# Patient Record
Sex: Female | Born: 2000 | Hispanic: No | Marital: Single | Smoking: Never smoker
Health system: Southern US, Community
[De-identification: ages and names within clinical notes are randomized; demographics above are authoritative.]

## PROBLEM LIST (undated history)

## (undated) DIAGNOSIS — O139 Gestational [pregnancy-induced] hypertension without significant proteinuria, unspecified trimester: Secondary | ICD-10-CM

## (undated) DIAGNOSIS — D573 Sickle-cell trait: Secondary | ICD-10-CM

## (undated) DIAGNOSIS — Z789 Other specified health status: Secondary | ICD-10-CM

## (undated) HISTORY — DX: Other specified health status: Z78.9

## (undated) HISTORY — DX: Gestational (pregnancy-induced) hypertension without significant proteinuria, unspecified trimester: O13.9

## (undated) HISTORY — PX: NO PAST SURGERIES: SHX2092

## (undated) HISTORY — DX: Sickle-cell trait: D57.3

---
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
## 2020-03-06 ENCOUNTER — Ambulatory Visit: Payer: Medicaid Other | Admitting: Family

## 2020-03-31 ENCOUNTER — Encounter: Payer: Self-pay | Admitting: Family

## 2020-03-31 ENCOUNTER — Other Ambulatory Visit (HOSPITAL_COMMUNITY)
Admission: RE | Admit: 2020-03-31 | Discharge: 2020-03-31 | Disposition: A | Discharge status: Home / Self Care | Payer: Medicaid Other | Source: Ambulatory Visit | Attending: Family | Admitting: Family

## 2020-03-31 ENCOUNTER — Other Ambulatory Visit: Payer: Self-pay

## 2020-03-31 ENCOUNTER — Ambulatory Visit (INDEPENDENT_AMBULATORY_CARE_PROVIDER_SITE_OTHER): Payer: Medicaid Other | Admitting: Pediatrics

## 2020-03-31 VITALS — BP 119/82 | HR 87 | Ht 59.65 in | Wt 185.6 lb

## 2020-03-31 DIAGNOSIS — Z113 Encounter for screening for infections with a predominantly sexual mode of transmission: Secondary | ICD-10-CM | POA: Insufficient documentation

## 2020-03-31 DIAGNOSIS — Z3046 Encounter for surveillance of implantable subdermal contraceptive: Secondary | ICD-10-CM | POA: Diagnosis not present

## 2020-03-31 NOTE — Progress Notes (Signed)
History was provided by the patient.  Judy Underwood is a 20 y.o. female who is here for nexplanon removal.  Theadore Nan, MD   HPI:  Pt reports that she is here for nexplanon removal. Had it placed in 09/2017. She just does not want it any longer, is currently "not really" sexually active. She does have plan B at her house if she were to need it.   Patient's last menstrual period was 03/24/2020.  Review of Systems  Constitutional: Negative for malaise/fatigue.  Eyes: Negative for double vision.  Respiratory: Negative for shortness of breath.   Cardiovascular: Negative for chest pain and palpitations.  Gastrointestinal: Negative for abdominal pain, constipation, diarrhea, nausea and vomiting.  Genitourinary: Negative for dysuria.  Musculoskeletal: Negative for joint pain and myalgias.  Skin: Negative for rash.  Neurological: Negative for dizziness and headaches.  Endo/Heme/Allergies: Does not bruise/bleed easily.    Patient Active Problem List   Diagnosis Date Noted  . Breast lump on left side at 4 o'clock position 06/15/2018  . Surveillance of implantable subdermal contraceptive 10/04/2014  . Heavy menses 02/01/2014  . Psychosocial stressors 07/16/2013    No current outpatient medications on file prior to visit.   No current facility-administered medications on file prior to visit.    No Known Allergies  Physical Exam:    Vitals:   03/31/20 1418  BP: 119/82  Pulse: 87  Weight: 185 lb 9.6 oz (84.2 kg)  Height: 4' 11.65" (1.515 m)    Blood pressure percentiles are not available for patients who are 18 years or older.  Physical Exam Vitals reviewed. Exam conducted with a chaperone present.  Constitutional:      Appearance: Normal appearance.  Skin:    Capillary Refill: Capillary refill takes less than 2 seconds.  Neurological:     Mental Status: She is alert.  Psychiatric:        Mood and Affect: Mood normal.     Assessment/Plan: 1. Encounter for  Nexplanon removal See procedure note. Tolerated well. We discussed other forms of contraception if needed and she has EC at home.   2. Routine screening for STI (sexually transmitted infection) Per protocol.  - Urine cytology ancillary only  Return PRN.   Alfonso Ramus, FNP

## 2020-03-31 NOTE — Patient Instructions (Signed)
Take Plan B within 72 hours if you are sexually active without condoms   Your Nexplanon was removed today and is no longer preventing pregnancy.  If you have sex, remember to use condoms to prevent pregnancy and to prevent sexually transmitted infections.  Leave the outside bandage on for 24 hours.  Leave the smaller bandages on for 3-5 days or until they fall off on their own.  Keep the area clean and dry for 3-5 days.  There is usually bruising or swelling at and around the removal site for a few days to a week after the removal.  If you see redness or pus draining from the removal site, call us immediately.  We would like you to return to the clinic for a follow-up visit in 1 month.  You can call [facility] 24 hours a day with any questions or concerns.  There is always a nurse or doctor available to take your call.  Call 9-1-1 if you have a life-threatening emergency.  For anything else, please call us at [phone] before heading to the ER.

## 2020-03-31 NOTE — Progress Notes (Signed)

## 2020-04-02 ENCOUNTER — Other Ambulatory Visit: Payer: Self-pay | Admitting: Pediatrics

## 2020-04-02 LAB — URINE CYTOLOGY ANCILLARY ONLY
Chlamydia: POSITIVE — AB
Comment: NEGATIVE
Comment: NORMAL
Neisseria Gonorrhea: NEGATIVE

## 2020-04-02 MED ORDER — AZITHROMYCIN 500 MG PO TABS: 1000.0000 mg | ORAL_TABLET | Freq: Once | ORAL | 0 refills | Status: AC

## 2020-08-26 ENCOUNTER — Emergency Department (HOSPITAL_COMMUNITY)
Admission: EM | Admit: 2020-08-26 | Discharge: 2020-08-26 | Disposition: A | Discharge status: Home / Self Care | Payer: Medicaid Other | Attending: Emergency Medicine | Admitting: Emergency Medicine

## 2020-08-26 ENCOUNTER — Other Ambulatory Visit: Payer: Self-pay

## 2020-08-26 ENCOUNTER — Encounter (HOSPITAL_COMMUNITY): Payer: Self-pay | Admitting: *Deleted

## 2020-08-26 ENCOUNTER — Emergency Department (HOSPITAL_COMMUNITY): Payer: Medicaid Other

## 2020-08-26 DIAGNOSIS — Z5321 Procedure and treatment not carried out due to patient leaving prior to being seen by health care provider: Secondary | ICD-10-CM | POA: Insufficient documentation

## 2020-08-26 DIAGNOSIS — R109 Unspecified abdominal pain: Secondary | ICD-10-CM | POA: Insufficient documentation

## 2020-08-26 DIAGNOSIS — M25552 Pain in left hip: Secondary | ICD-10-CM | POA: Diagnosis not present

## 2020-08-26 DIAGNOSIS — Y92410 Unspecified street and highway as the place of occurrence of the external cause: Secondary | ICD-10-CM | POA: Diagnosis not present

## 2020-08-26 DIAGNOSIS — M25551 Pain in right hip: Secondary | ICD-10-CM | POA: Diagnosis not present

## 2020-08-26 LAB — I-STAT BETA HCG BLOOD, ED (MC, WL, AP ONLY): I-stat hCG, quantitative: <5 m[IU]/mL (ref <5)
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
## 2020-08-26 NOTE — ED Notes (Signed)
Lavender and light green tube sent to lab. 

## 2020-08-26 NOTE — ED Notes (Signed)
Pt called, no response
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
## 2020-09-20 ENCOUNTER — Other Ambulatory Visit: Payer: Self-pay

## 2020-09-20 ENCOUNTER — Inpatient Hospital Stay (HOSPITAL_COMMUNITY): Payer: Medicaid Other

## 2020-09-20 ENCOUNTER — Encounter (HOSPITAL_COMMUNITY): Payer: Self-pay | Admitting: Obstetrics & Gynecology

## 2020-09-20 ENCOUNTER — Ambulatory Visit (HOSPITAL_COMMUNITY)
Admission: EM | Admit: 2020-09-20 | Discharge: 2020-09-20 | Disposition: A | Discharge status: Home / Self Care | Payer: Medicaid Other | Attending: Urgent Care | Admitting: Urgent Care

## 2020-09-20 ENCOUNTER — Inpatient Hospital Stay (HOSPITAL_COMMUNITY)
Admission: AD | Admit: 2020-09-20 | Discharge: 2020-09-20 | Disposition: A | Discharge status: Home / Self Care | Payer: Medicaid Other | Source: Ambulatory Visit | Attending: Obstetrics & Gynecology | Admitting: Obstetrics & Gynecology

## 2020-09-20 DIAGNOSIS — N912 Amenorrhea, unspecified: Secondary | ICD-10-CM | POA: Diagnosis not present

## 2020-09-20 DIAGNOSIS — O26891 Other specified pregnancy related conditions, first trimester: Secondary | ICD-10-CM

## 2020-09-20 DIAGNOSIS — O23591 Infection of other part of genital tract in pregnancy, first trimester: Secondary | ICD-10-CM | POA: Diagnosis not present

## 2020-09-20 DIAGNOSIS — R1084 Generalized abdominal pain: Secondary | ICD-10-CM

## 2020-09-20 DIAGNOSIS — Z3A01 Less than 8 weeks gestation of pregnancy: Secondary | ICD-10-CM | POA: Diagnosis not present

## 2020-09-20 DIAGNOSIS — Z3201 Encounter for pregnancy test, result positive: Secondary | ICD-10-CM | POA: Diagnosis not present

## 2020-09-20 DIAGNOSIS — N760 Acute vaginitis: Secondary | ICD-10-CM | POA: Diagnosis not present

## 2020-09-20 DIAGNOSIS — O26899 Other specified pregnancy related conditions, unspecified trimester: Secondary | ICD-10-CM

## 2020-09-20 DIAGNOSIS — R109 Unspecified abdominal pain: Secondary | ICD-10-CM | POA: Diagnosis not present

## 2020-09-20 DIAGNOSIS — B9689 Other specified bacterial agents as the cause of diseases classified elsewhere: Secondary | ICD-10-CM | POA: Insufficient documentation

## 2020-09-20 LAB — WET PREP, GENITAL
Sperm: NONE SEEN
Trich, Wet Prep: NONE SEEN
Yeast Wet Prep HPF POC: NONE SEEN

## 2020-09-20 LAB — URINALYSIS, ROUTINE W REFLEX MICROSCOPIC
Bacteria, UA: NONE SEEN
Bilirubin Urine: NEGATIVE
Glucose, UA: NEGATIVE mg/dL
Hgb urine dipstick: NEGATIVE
Ketones, ur: 80 mg/dL — AB
Nitrite: NEGATIVE
Protein, ur: NEGATIVE mg/dL
Specific Gravity, Urine: 1.028 (ref 1.005–1.030)
pH: 6 (ref 5.0–8.0)

## 2020-09-20 LAB — CBC
HCT: 38.7 % (ref 36.0–46.0)
Hemoglobin: 12.9 g/dL (ref 12.0–15.0)
MCH: 28.9 pg (ref 26.0–34.0)
MCHC: 33.3 g/dL (ref 30.0–36.0)
MCV: 86.6 fL (ref 80.0–100.0)
Platelets: 303 10*3/uL (ref 150–400)
RBC: 4.47 MIL/uL (ref 3.87–5.11)
RDW: 12.5 % (ref 11.5–15.5)
WBC: 11.4 10*3/uL — ABNORMAL HIGH (ref 4.0–10.5)
nRBC: 0 % (ref 0.0–0.2)

## 2020-09-20 LAB — ABO/RH: ABO/RH(D): O POS

## 2020-09-20 LAB — HCG, QUANTITATIVE, PREGNANCY: hCG, Beta Chain, Quant, S: 9267 m[IU]/mL — ABNORMAL HIGH (ref <5)

## 2020-09-20 LAB — POC URINE PREG, ED: Preg Test, Ur: POSITIVE — AB

## 2020-09-20 MED ORDER — METRONIDAZOLE 0.75 % VA GEL: 1.0000 | Freq: Every day | VAGINAL | 1 refills | Status: DC

## 2020-09-20 NOTE — MAU Provider Note (Signed)
Chief Complaint:  Abdominal Pain   Event Date/Time   First Provider Initiated Contact with Patient 09/20/20 1608     HPI: Judy Underwood is a 20 y.o. G1P0 at [redacted]w[redacted]d who presents to maternity admissions reporting positive pregnancy test and abdominal pain. Patient reports she was seen at Urgent Care this morning for confirmation of pregnancy and after the MD did his physical assessment she started to have generalized stabbing right sided pain. She denies vaginal bleeding, discharge, or urinary s/s. LMP was 08/14/20  Pregnancy Course:   Past Medical History:  Diagnosis Date   Sickle cell trait (HCC) 07/16/2013   OB History  Gravida Para Term Preterm AB Living  1            SAB IAB Ectopic Multiple Live Births               # Outcome Date GA Lbr Len/2nd Weight Sex Delivery Anes PTL Lv  1 Current            History reviewed. No pertinent surgical history. Family History  Problem Relation Age of Onset   Asthma Neg Hx    Eczema Neg Hx    Social History   Tobacco Use   Smoking status: Never   Smokeless tobacco: Never  Substance Use Topics   Alcohol use: Never    Alcohol/week: 0.0 standard drinks   Drug use: Never   No Known Allergies No medications prior to admission.    I have reviewed patient's Past Medical Hx, Surgical Hx, Family Hx, Social Hx, medications and allergies.   ROS:  Review of Systems  Constitutional: Negative.   Respiratory: Negative.    Cardiovascular: Negative.   Gastrointestinal:  Positive for abdominal pain. Negative for diarrhea, nausea and vomiting.  Genitourinary: Negative.   Musculoskeletal: Negative.   Neurological: Negative.   Psychiatric/Behavioral: Negative.     Physical Exam  Patient Vitals for the past 24 hrs:  BP Pulse Resp SpO2  09/20/20 1755 126/73 79 14 --  09/20/20 1558 118/70 82 -- --  09/20/20 1550 121/63 79 13 100 %   Constitutional: well-developed, well-nourished female in no acute distress.  Cardiovascular: normal  rate Respiratory: normal effort GI: abd soft, non-tender MS: extremities nontender, no edema, normal ROM Neurologic: alert and oriented x 4.  GU: neg CVAT. Pelvic: deferred, blind swabs obtained    Labs: Results for orders placed or performed during the hospital encounter of 09/20/20 (from the past 24 hour(s))  Wet prep, genital     Status: Abnormal   Collection Time: 09/20/20  4:09 PM   Specimen: Vaginal  Result Value Ref Range   Yeast Wet Prep HPF POC NONE SEEN NONE SEEN   Trich, Wet Prep NONE SEEN NONE SEEN   Clue Cells Wet Prep HPF POC PRESENT (A) NONE SEEN   WBC, Wet Prep HPF POC MANY (A) NONE SEEN   Sperm NONE SEEN   CBC     Status: Abnormal   Collection Time: 09/20/20  4:22 PM  Result Value Ref Range   WBC 11.4 (H) 4.0 - 10.5 K/uL   RBC 4.47 3.87 - 5.11 MIL/uL   Hemoglobin 12.9 12.0 - 15.0 g/dL   HCT 10.9 32.3 - 55.7 %   MCV 86.6 80.0 - 100.0 fL   MCH 28.9 26.0 - 34.0 pg   MCHC 33.3 30.0 - 36.0 g/dL   RDW 32.2 02.5 - 42.7 %   Platelets 303 150 - 400 K/uL   nRBC 0.0 0.0 - 0.2 %  hCG, quantitative, pregnancy     Status: Abnormal   Collection Time: 09/20/20  4:22 PM  Result Value Ref Range   hCG, Beta Chain, Quant, S 9,267 (H) <5 mIU/mL  ABO/Rh     Status: None   Collection Time: 09/20/20  4:22 PM  Result Value Ref Range   ABO/RH(D) O POS    No rh immune globuloin      NOT A RH IMMUNE GLOBULIN CANDIDATE, PT RH POSITIVE Performed at [facility], [address], [place], [place] [address]     Imaging:  US OB LESS THAN 14 WEEKS WITH [place] TRANSVAGINAL  Result Date: 09/20/2020 CLINICAL DATA:  Abdominal pain. EXAM: OBSTETRIC <14 WK ULTRASOUND TECHNIQUE: Transabdominal ultrasound was performed for evaluation of the gestation as well as the maternal uterus and adnexal regions. COMPARISON:  None. FINDINGS: Intrauterine gestational sac: Single Yolk sac:  Visualized Embryo:  Not Visualized. MSD:  8.4 mm   5 w   4 d CRL:     mm    w  d                  [place] EDC:  Subchorionic hemorrhage:  None visualized. Maternal uterus/adnexae: Normal. IMPRESSION: An IUP is identified. There is a gestational sac and a yolk sac. A fetal pole is not yet seen. Recommend attention on follow-up. Electronically Signed   By: Gerome Sam III M.D   On: 09/20/2020 17:32     MAU Course: Orders Placed This Encounter  Procedures   Wet prep, genital   US OB LESS THAN 14 WEEKS WITH OB TRANSVAGINAL   US OB LESS THAN 14 WEEKS WITH OB TRANSVAGINAL   CBC   hCG, quantitative, pregnancy   Urinalysis, Routine w reflex microscopic   ABO/Rh   Discharge patient   Meds ordered this encounter  Medications   metroNIDAZOLE (METROGEL) 0.75 % vaginal gel    Sig: Place 1 Applicatorful vaginally at bedtime. Apply one applicatorful to vagina at bedtime for 5 days    Dispense:  70 g    Refill:  1    Order Specific Question:   Supervising Provider    Answer:   Reva Bores [[id number]]    MDM: UA CBC, Hcg, ABO Ultrasound with results as above Wet prep +BV, will RX metrogel GC/CT pending Recommend repeat US in 10-14 days. Precautions given.    Assessment: 1. BV (bacterial vaginosis)   2. Abdominal pain affecting pregnancy     Plan: Discharge home in stable condition  Strict return precautions given Follow [facility] with [facility] for [facility]   Follow-up Information     [facility] Follow [facility].   Specialty: Radiology Why: someone will call you to schedule ultrasound. Contact information: [address] [place] [address] [phone]        [facility] [employer] [facility] .   Specialty: Obstetrics and Gynecology Contact information: [address] [place] [address] [phone]                Allergies as of 09/20/2020   No Known Allergies      Medication List     TAKE these medications    metroNIDAZOLE 0.75 % vaginal gel Commonly known  as: METROGEL Place 1 Applicatorful vaginally at bedtime. Apply one applicatorful to vagina at bedtime for 5 days        Camelia Eng, MSN, CNM 09/20/2020 6:20 PM

## 2020-09-20 NOTE — Discharge Instructions (Addendum)
Please report to the Maternal Admission Unit for an emergent evaluation and rule out of an ectopic pregnancy given your abdominal pain and positive pregnancy test.

## 2020-09-20 NOTE — ED Triage Notes (Signed)
Pt reports here with breast soreness and missed period, requests preg test.

## 2020-09-20 NOTE — ED Provider Notes (Signed)
[facility]   MRN: [id number] DOB: 10-29-2000  Subjective:   Judy Underwood is a 20 y.o. female presenting for amenorrhea.  Patient reports LMP was 08/14/2020.  She is also has some breast soreness, nausea without vomiting and right lower quadrant abdominal pain.  This pain has been intermittent, does not have any at the moment.  Denies fever, vaginal discharge, dysuria, urinary frequency.  Patient is sexually active, does not use condoms for protection.  No oral contraception.  No current facility-administered medications for this encounter. No current outpatient medications on file.   No Known Allergies  Past Medical History:  Diagnosis Date   Sickle cell trait (HCC) 07/16/2013     No past surgical history on file.  Family History  Problem Relation Age of Onset   Asthma Neg Hx    Eczema Neg Hx     Social History   Tobacco Use   Smoking status: Never   Smokeless tobacco: Never  Substance Use Topics   Alcohol use: Never    Alcohol/week: 0.0 standard drinks   Drug use: Never    ROS   Objective:   Vitals: BP 112/72   Pulse 74   Temp 98.4 F (36.9 C)   Resp 18   LMP 08/14/2020   SpO2 99%   Physical Exam Constitutional:      General: She is not in acute distress.    Appearance: Normal appearance. She is well-developed and normal weight. She is not ill-appearing, toxic-appearing or diaphoretic.  HENT:     Head: Normocephalic and atraumatic.     Right Ear: External ear normal.     Left Ear: External ear normal.     Nose: Nose normal.     Mouth/Throat:     Mouth: Mucous membranes are moist.     Pharynx: Oropharynx is clear.  Eyes:     General: No scleral icterus.    Extraocular Movements: Extraocular movements intact.     Pupils: Pupils are equal, round, and reactive to light.  Cardiovascular:     Rate and Rhythm: Normal rate and regular rhythm.     Heart sounds: Normal heart sounds. No murmur heard.   No friction rub. No gallop.   Pulmonary:     Effort: Pulmonary effort is normal. No respiratory distress.     Breath sounds: Normal breath sounds. No stridor. No wheezing, rhonchi or rales.  Abdominal:     General: Bowel sounds are normal. There is no distension.     Palpations: Abdomen is soft. There is no mass.     Tenderness: There is abdominal tenderness in the right upper quadrant, right lower quadrant, epigastric area and left upper quadrant. There is no right CVA tenderness, left CVA tenderness, guarding or rebound.  Skin:    General: Skin is warm and dry.     Coloration: Skin is not pale.     Findings: No rash.  Neurological:     General: No focal deficit present.     Mental Status: She is alert and oriented to person, place, and time.  Psychiatric:        Mood and Affect: Mood normal.        Behavior: Behavior normal.        Thought Content: Thought content normal.        Judgment: Judgment normal.    Results for orders placed or performed during the hospital encounter of 09/20/20 (from the past 24 hour(s))  POC urine pregnancy  Status: Abnormal   Collection Time: 09/20/20  1:44 PM  Result Value Ref Range   Preg Test, Ur POSITIVE (A) NEGATIVE    Assessment and Plan :   PDMP not reviewed this encounter.  1. Positive pregnancy test   2. Amenorrhea   3. Generalized abdominal pain     As patient has a positive pregnancy test and abdominal pain on exam recommended evaluation at the [facility] emergency room through the [facility].  I provided her with instructions on how to get there.  She is hemodynamically stable and therefore capable of transport by personal vehicle.  Contracts for safety and will report there now for rule out of ectopic pregnancy.   Judy Underwood, [place] 09/20/20 [phone]

## 2020-09-20 NOTE — MAU Note (Addendum)
Judy Underwood is a 20 y.o. at [redacted]w[redacted]d here in MAU reporting: lower right abdominal pain that started this morning when the doctor at urgent care pushed on her stomach. No pain before then. States she only went to urgent care to confirm her pregnancy. Denies any VB. Last IC was last week.  LMP: 08/14/20  Pain score: 4

## 2020-09-22 LAB — GC/CHLAMYDIA PROBE AMP (~~LOC~~) NOT AT ARMC
Chlamydia: POSITIVE — AB
Comment: NEGATIVE
Comment: NORMAL
Neisseria Gonorrhea: NEGATIVE

## 2020-09-23 ENCOUNTER — Other Ambulatory Visit: Payer: Self-pay

## 2020-09-23 DIAGNOSIS — O98819 Other maternal infectious and parasitic diseases complicating pregnancy, unspecified trimester: Secondary | ICD-10-CM

## 2020-09-23 MED ORDER — AZITHROMYCIN 500 MG PO TABS: 1000.0000 mg | ORAL_TABLET | Freq: Once | ORAL | 0 refills | Status: AC

## 2020-10-01 ENCOUNTER — Ambulatory Visit
Admission: RE | Admit: 2020-10-01 | Discharge: 2020-10-01 | Disposition: A | Discharge status: Home / Self Care | Payer: Medicaid Other | Source: Ambulatory Visit

## 2020-10-01 ENCOUNTER — Other Ambulatory Visit: Payer: Self-pay

## 2020-10-01 DIAGNOSIS — R109 Unspecified abdominal pain: Secondary | ICD-10-CM | POA: Diagnosis not present

## 2020-10-01 DIAGNOSIS — O26891 Other specified pregnancy related conditions, first trimester: Secondary | ICD-10-CM | POA: Diagnosis not present

## 2020-10-01 DIAGNOSIS — Z3A01 Less than 8 weeks gestation of pregnancy: Secondary | ICD-10-CM | POA: Diagnosis not present

## 2020-10-01 DIAGNOSIS — O26899 Other specified pregnancy related conditions, unspecified trimester: Secondary | ICD-10-CM | POA: Diagnosis not present

## 2020-10-01 DIAGNOSIS — O3680X0 Pregnancy with inconclusive fetal viability, not applicable or unspecified: Secondary | ICD-10-CM | POA: Diagnosis not present

## 2020-10-18 ENCOUNTER — Ambulatory Visit (HOSPITAL_COMMUNITY)
Admission: EM | Admit: 2020-10-18 | Discharge: 2020-10-18 | Disposition: A | Discharge status: Home / Self Care | Payer: Medicaid Other | Attending: Emergency Medicine | Admitting: Emergency Medicine

## 2020-10-18 ENCOUNTER — Encounter (HOSPITAL_COMMUNITY): Payer: Self-pay

## 2020-10-18 DIAGNOSIS — Z113 Encounter for screening for infections with a predominantly sexual mode of transmission: Secondary | ICD-10-CM | POA: Diagnosis not present

## 2020-10-18 NOTE — ED Triage Notes (Addendum)
Pt states she was diagnosed with chlamydia and treated and just wanted to be checked again. Denies symptoms. Currently [redacted]W[redacted]D pregnant.

## 2020-10-18 NOTE — Discharge Instructions (Signed)
We will contact you if the results from your lab work are positive and require additional treatment.    Do not have sex while taking undergoing treatment for STI.  Make sure that all of your partners get tested and treated.   Use a condom or other barrier method for all sexual encounters.    Return or go to the Emergency Department if symptoms worsen or do not improve in the next few days.  

## 2020-10-18 NOTE — ED Provider Notes (Addendum)
[facility]    CSN: [id number] Arrival date & time: 10/18/20  1252      History   Chief Complaint Chief Complaint  Patient presents with   Exposure to STD    HPI Judy Underwood is a 20 y.o. female.   Patient here for STI screen.  Reports testing positive for chlamydia about 1 month ago and was treated.  Denies any current symptoms.  Patient is [redacted]w[redacted]d pregnant. Denies any known exposures to STI positive partners.  Denies any trauma, injury, or other precipitating event.  Denies any specific alleviating or aggravating factors.  Denies any fevers, chest pain, shortness of breath, N/V/D, numbness, tingling, weakness, abdominal pain, or headaches.     The history is provided by the patient.  Exposure to STD   Past Medical History:  Diagnosis Date   Sickle cell trait (HCC) 07/16/2013    Patient Active Problem List   Diagnosis Date Noted   Breast lump on left side at 4 o'clock position 06/15/2018   Surveillance of implantable subdermal contraceptive 10/04/2014   Heavy menses 02/01/2014   Psychosocial stressors 07/16/2013    History reviewed. No pertinent surgical history.  OB History     Gravida  1   Para      Term      Preterm      AB      Living         SAB      IAB      Ectopic      Multiple      Live Births               Home Medications    Prior to Admission medications   Medication Sig Start Date End Date Taking? Authorizing Provider  metroNIDAZOLE (METROGEL) 0.75 % vaginal gel Place 1 Applicatorful vaginally at bedtime. Apply one applicatorful to vagina at bedtime for 5 days 09/20/20   Brand Males, CNM    Family History Family History  Problem Relation Age of Onset   Asthma Neg Hx    Eczema Neg Hx     Social History Social History   Tobacco Use   Smoking status: Never   Smokeless tobacco: Never  Substance Use Topics   Alcohol use: Never    Alcohol/week: 0.0 standard drinks   Drug use: Never     Allergies    Patient has no known allergies.   Review of Systems Review of Systems  Genitourinary:  Negative for vaginal bleeding, vaginal discharge and vaginal pain.  All other systems reviewed and are negative.   Physical Exam Triage Vital Signs ED Triage Vitals  Enc Vitals Group     BP 10/18/20 1330 (!) 119/59     Pulse Rate 10/18/20 1330 69     Resp 10/18/20 1330 18     Temp 10/18/20 1330 98.1 F (36.7 C)     Temp Source 10/18/20 1330 Oral     SpO2 10/18/20 1330 100 %     Weight --      Height --      Head Circumference --      Peak Flow --      Pain Score 10/18/20 1332 0     Pain Loc --      Pain Edu? --      Excl. in GC? --    No data found.  Updated Vital Signs BP (!) 119/59 (BP Location: Left Arm)   Pulse 69   Temp 98.1  F (36.7 C) (Oral)   Resp 18   LMP 08/14/2020   SpO2 100%   Visual Acuity Right Eye Distance:   Left Eye Distance:   Bilateral Distance:    Right Eye Near:   Left Eye Near:    Bilateral Near:     Physical Exam Vitals and nursing note reviewed.  Constitutional:      General: She is not in acute distress.    Appearance: Normal appearance. She is not ill-appearing, toxic-appearing or diaphoretic.  HENT:     Head: Normocephalic and atraumatic.  Eyes:     Conjunctiva/sclera: Conjunctivae normal.  Cardiovascular:     Rate and Rhythm: Normal rate.     Pulses: Normal pulses.  Pulmonary:     Effort: Pulmonary effort is normal.  Abdominal:     General: Abdomen is flat.  Musculoskeletal:        General: Normal range of motion.     Cervical back: Normal range of motion.  Skin:    General: Skin is warm and dry.  Neurological:     General: No focal deficit present.     Mental Status: She is alert and oriented to person, place, and time.  Psychiatric:        Mood and Affect: Mood normal.     UC Treatments / Results  Labs (all labs ordered are listed, but only abnormal results are displayed) Labs Reviewed  CERVICOVAGINAL ANCILLARY ONLY     EKG   Radiology No results found.  Procedures Procedures (including critical care time)  Medications Ordered in UC Medications - No data to display  Initial Impression / Assessment and Plan / UC Course  I have reviewed the triage vital signs and the nursing notes.  Pertinent labs & imaging results that were available during my care of the patient were reviewed by me and considered in my medical decision making (see chart for details).    Assessment negative for red flags or concerns.  Self swab obtained and will treat based on results.  Discussed need to abstain from sex while waiting on treatment.  Discussed that patient should not have sex while undergoing treatment for STI and safe sex practices including condom or other barrier method use.  Follow up with OB/GYN.  Final Clinical Impressions(s) / UC Diagnoses   Final diagnoses:  Screen for STD (sexually transmitted disease)     Discharge Instructions      We will contact you if the results from your lab work are positive and require additional treatment.    Do not have sex while taking undergoing treatment for STI.  Make sure that all of your partners get tested and treated.   Use a condom or other barrier method for all sexual encounters.    Return or go to the Emergency Department if symptoms worsen or do not improve in the next few days.       ED Prescriptions   None    PDMP not reviewed this encounter.   Ivette Loyal, NP 10/18/20 1351    Ivette Loyal, NP 10/18/20 1351

## 2020-10-20 LAB — CERVICOVAGINAL ANCILLARY ONLY
Bacterial Vaginitis (gardnerella): NEGATIVE
Candida Glabrata: NEGATIVE
Candida Vaginitis: POSITIVE — AB
Chlamydia: NEGATIVE
Comment: NEGATIVE
Comment: NEGATIVE
Comment: NEGATIVE
Comment: NEGATIVE
Comment: NEGATIVE
Comment: NORMAL
Neisseria Gonorrhea: NEGATIVE
Trichomonas: NEGATIVE

## 2020-10-21 ENCOUNTER — Telehealth (HOSPITAL_COMMUNITY): Payer: Self-pay | Admitting: Emergency Medicine

## 2020-10-21 MED ORDER — TERCONAZOLE 0.4 % VA CREA: 1.0000 | TOPICAL_CREAM | Freq: Every day | VAGINAL | 0 refills | Status: AC

## 2020-10-27 ENCOUNTER — Other Ambulatory Visit: Payer: Self-pay

## 2020-10-27 ENCOUNTER — Ambulatory Visit (INDEPENDENT_AMBULATORY_CARE_PROVIDER_SITE_OTHER): Payer: Medicaid Other | Admitting: *Deleted

## 2020-10-27 VITALS — BP 129/76 | HR 75 | Temp 98.3°F | Wt 181.2 lb

## 2020-10-27 DIAGNOSIS — O368390 Maternal care for abnormalities of the fetal heart rate or rhythm, unspecified trimester, not applicable or unspecified: Secondary | ICD-10-CM

## 2020-10-27 DIAGNOSIS — O219 Vomiting of pregnancy, unspecified: Secondary | ICD-10-CM

## 2020-10-27 DIAGNOSIS — Z3400 Encounter for supervision of normal first pregnancy, unspecified trimester: Secondary | ICD-10-CM | POA: Diagnosis not present

## 2020-10-27 DIAGNOSIS — Z31430 Encounter of female for testing for genetic disease carrier status for procreative management: Secondary | ICD-10-CM | POA: Diagnosis not present

## 2020-10-27 HISTORY — DX: Encounter for supervision of normal first pregnancy, unspecified trimester: Z34.00

## 2020-10-27 LAB — OB RESULTS CONSOLE GBS: GBS: POSITIVE

## 2020-10-27 MED ORDER — PROMETHAZINE HCL 25 MG PO TABS: 25.0000 mg | ORAL_TABLET | Freq: Four times a day (QID) | ORAL | 1 refills | Status: DC | PRN

## 2020-10-27 MED ORDER — BLOOD PRESSURE MONITOR AUTOMAT DEVI: 1.0000 | Freq: Every day | 0 refills | Status: DC

## 2020-10-27 MED ORDER — PRENATAL 27-1 MG PO TABS: 1.0000 | ORAL_TABLET | Freq: Every day | ORAL | 12 refills | Status: DC

## 2020-10-27 MED ORDER — GOJJI WEIGHT SCALE MISC: 1.0000 | Freq: Every day | 0 refills | Status: DC | PRN

## 2020-10-27 NOTE — Progress Notes (Signed)
   Location: [facility] Patient: Clinic Provider: clinic  PRENATAL INTAKE SUMMARY  Ms. Abernathy presents today New OB Nurse Interview.  OB History     Gravida  1   Para      Term      Preterm      AB      Living         SAB      IAB      Ectopic      Multiple      Live Births             I have reviewed the patient's medical, obstetrical, social, and family histories, medications, and available lab results.  SUBJECTIVE She complains of nausea without vomiting for 3-4 days. Also reported off white vaginal discharge, denies odor, itching or burning.  OBJECTIVE Initial Nurse interview for history/labs (New OB).  EDD: 05/21/2021 by LMP GA: [redacted]w[redacted]d G1P0 FHT: unable to hear fetal heart tones  GENERAL APPEARANCE: alert, well appearing, in no apparent distress, oriented to person, place and time, overweight   ASSESSMENT Normal pregnancy  PLAN Prenatal care:  [facility] Pnl/HIV/Hep C OB Urine Culture GC/CT HgbEval/SMA/CF ([facility]) [facility]ama A1C Rx [facility] for blood pressure monitor and weight scale Rx for PNV Rx for Phenergan 25 mg 1 tab PO every 6 hours as needed for nausea Ultrasound MFM >14 weeks complete for anatomy scan Ultrasound <14 weeks to assess viability and fetal heart tones  Follow Up Instructions:   I discussed the assessment and treatment plan with the patient. The patient was provided an opportunity to ask questions and all were answered. The patient agreed with the plan and demonstrated an understanding of the instructions.   The patient was advised to call back or seek an in-person evaluation if the symptoms worsen or if the condition fails to improve as anticipated.  I provided 40 minutes of  face-to-face time during this encounter.  Clovis Pu, RN

## 2020-10-27 NOTE — Patient Instructions (Signed)
 Genetic Screening Results Information: You are having genetic testing called [facility] today.  It will take approximately 2 weeks before the results are available.  To get your results, you need Internet access to a web browser to search [facility]/MyChart (the direct app on your phone will not give you these results).  Then select Lab Scanned and click on the blue hyper link that says View Image to see your Panorama results.  You can also use the directions on the purple card given to look up your results directly on the [facility] website.   AREA PEDIATRIC/FAMILY PRACTICE PHYSICIANS  [facility] [address] [place], [place] [address] Phone - [phone]   Fax - [phone]  Judy Underwood [address] [place], [place]  [address] Phone - [phone]   Fax - [phone]  [facility] [address] [place], [place]  [address] Phone - [phone]   Fax - [phone]  [facility] [address] [place], [place]  [address] Phone - [phone]   Fax - [phone]   [facility] [address] [place], [place]  [address] Phone - [phone]   Fax - [phone]  [facility] [address] [place], [place]  [address] Phone - [phone]   Fax - [phone]  [facility] [address] [place], [place]  [address] Phone - [phone]   Fax - [phone]  [facility] [address] [place], [place]  [address] Phone - [phone]   Fax - [phone]  [facility] [address] [place], [place]  [address] Phone - [phone]   Fax - [phone] [facility] [address] [place], [place]  [address] Phone - [phone]   Fax - [phone]  [facility] [address] [place], [place]  [address] Phone -  [phone]   Fax - [phone]  [facility] [address] [place], [place]  [address] Phone - [phone]   Fax - [phone]  [facility] [address] [place], [place]  [address] Phone - [phone]   Fax - [phone]  Judy Underwood [address] [place], [place]  [address] Phone - [phone]  [facility] [address] [place], [place]  [address] Phone - [phone]   Fax - [phone]  [facility] [address] [place], [place]  [address] Phone - [phone]   Fax - [phone]  [facility] [address] [place], [place]  [address] Phone - [phone]   Fax - [phone]  [facility] [address] [place], [place]  [address] Phone - [phone]   Fax - [phone]   [facility] [address] [place], [place]  [address] Phone - [phone]   Fax - [phone]  [facility] [address] [place], [place]  [address] Phone - [phone]   Fax - [phone]  Jud[facility] Underwood [address] , [place]  [address] Phone - [phone]   Fax - [phone]  [facility] [address] , [place]  [address] Phone - [phone]   Fax - [phone]  [facility] [address] , [place]  [address] Phone - [phone]   Fax - [phone] [facility]   [address] [place], [place]  [address] Phone - [phone]   Fax - [phone]  [facility] [address] [place], [place]  [address] Phone - [phone]   Fax - [phone]  [facility] [address] [place], [place]  [address] Phone - [phone]   Fax - [phone]   

## 2020-10-28 LAB — OBSTETRIC PANEL, INCLUDING HIV
Antibody Screen: NEGATIVE
Basophils Absolute: 0 10*3/uL (ref 0.0–0.2)
Basos: 0 %
EOS (ABSOLUTE): 0.1 10*3/uL (ref 0.0–0.4)
Eos: 1 %
HIV Screen 4th Generation wRfx: NONREACTIVE
Hematocrit: 40.6 % (ref 34.0–46.6)
Hemoglobin: 13 g/dL (ref 11.1–15.9)
Hepatitis B Surface Ag: NEGATIVE
Immature Grans (Abs): 0 10*3/uL (ref 0.0–0.1)
Immature Granulocytes: 0 %
Lymphocytes Absolute: 1.6 10*3/uL (ref 0.7–3.1)
Lymphs: 19 %
MCH: 27.5 pg (ref 26.6–33.0)
MCHC: 32 g/dL (ref 31.5–35.7)
MCV: 86 fL (ref 79–97)
Monocytes Absolute: 0.5 10*3/uL (ref 0.1–0.9)
Monocytes: 6 %
Neutrophils Absolute: 6.2 10*3/uL (ref 1.4–7.0)
Neutrophils: 74 %
Platelets: 305 10*3/uL (ref 150–450)
RBC: 4.72 x10E6/uL (ref 3.77–5.28)
RDW: 12.2 % (ref 11.7–15.4)
RPR Ser Ql: NONREACTIVE
Rh Factor: POSITIVE
Rubella Antibodies, IGG: 9.14 index (ref >0.99)
WBC: 8.3 10*3/uL (ref 3.4–10.8)

## 2020-10-28 LAB — HEMOGLOBIN A1C
Est. average glucose Bld gHb Est-mCnc: 88 mg/dL
Hgb A1c MFr Bld: 4.7 % — ABNORMAL LOW (ref 4.8–5.6)

## 2020-10-28 LAB — HEPATITIS C ANTIBODY: Hep C Virus Ab: 0.2 s/co ratio (ref 0.0–0.9)

## 2020-11-02 LAB — CULTURE, OB URINE

## 2020-11-02 LAB — URINE CULTURE, OB REFLEX

## 2020-11-04 ENCOUNTER — Emergency Department (HOSPITAL_COMMUNITY): Payer: Medicaid Other

## 2020-11-04 ENCOUNTER — Encounter (HOSPITAL_COMMUNITY): Payer: Self-pay

## 2020-11-04 ENCOUNTER — Emergency Department (HOSPITAL_COMMUNITY)
Admission: EM | Admit: 2020-11-04 | Discharge: 2020-11-04 | Disposition: A | Discharge status: Home / Self Care | Payer: Medicaid Other | Attending: Emergency Medicine | Admitting: Emergency Medicine

## 2020-11-04 ENCOUNTER — Ambulatory Visit: Admission: RE | Admit: 2020-11-04 | Payer: No Typology Code available for payment source | Source: Ambulatory Visit

## 2020-11-04 ENCOUNTER — Other Ambulatory Visit: Payer: Self-pay

## 2020-11-04 DIAGNOSIS — R079 Chest pain, unspecified: Secondary | ICD-10-CM | POA: Diagnosis not present

## 2020-11-04 DIAGNOSIS — Z3A12 12 weeks gestation of pregnancy: Secondary | ICD-10-CM | POA: Diagnosis not present

## 2020-11-04 DIAGNOSIS — S60419A Abrasion of unspecified finger, initial encounter: Secondary | ICD-10-CM | POA: Diagnosis not present

## 2020-11-04 DIAGNOSIS — S60410A Abrasion of right index finger, initial encounter: Secondary | ICD-10-CM | POA: Diagnosis not present

## 2020-11-04 DIAGNOSIS — Z3A11 11 weeks gestation of pregnancy: Secondary | ICD-10-CM | POA: Diagnosis not present

## 2020-11-04 DIAGNOSIS — Y92410 Unspecified street and highway as the place of occurrence of the external cause: Secondary | ICD-10-CM | POA: Insufficient documentation

## 2020-11-04 DIAGNOSIS — R0689 Other abnormalities of breathing: Secondary | ICD-10-CM | POA: Diagnosis not present

## 2020-11-04 DIAGNOSIS — O9A211 Injury, poisoning and certain other consequences of external causes complicating pregnancy, first trimester: Secondary | ICD-10-CM | POA: Insufficient documentation

## 2020-11-04 DIAGNOSIS — R1900 Intra-abdominal and pelvic swelling, mass and lump, unspecified site: Secondary | ICD-10-CM | POA: Diagnosis not present

## 2020-11-04 DIAGNOSIS — I313 Pericardial effusion (noninflammatory): Secondary | ICD-10-CM | POA: Diagnosis not present

## 2020-11-04 NOTE — Discharge Instructions (Addendum)
Please follow-up with your OB/GYN to request an ultrasound time.  If you have abdominal pain or cramping or bleeding, you should come back to the ER or the maternal assessment unit at the [facility].  Change dressings daily on your fingers.  If you develop redness, swelling, drainage, come back to ER for reassessment.

## 2020-11-04 NOTE — ED Notes (Signed)
Pt verbalizes understanding of discharge instructions. Opportunity for questions and answers were provided. Pt discharged from the ED.   ?
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
## 2020-11-04 NOTE — ED Triage Notes (Signed)
Pt reported during triage that OB was unable to obtain fetal heart tones at visit last week. She was on the way to follow up appointment. Ultrasound brought to bedside and I was also unable to obtain fetal heart tones. MD Dkystra notified

## 2020-11-05 ENCOUNTER — Telehealth: Payer: Self-pay

## 2020-11-05 NOTE — Telephone Encounter (Signed)
Transition Care Management Unsuccessful Follow-up Telephone Call  Date of discharge and from where:  11/04/2020-[place] [facility]   Attempts:  1st Attempt  Reason for unsuccessful TCM follow-up call:  Unable to leave message

## 2020-11-06 ENCOUNTER — Encounter: Payer: Self-pay | Admitting: Obstetrics and Gynecology

## 2020-11-06 ENCOUNTER — Encounter: Payer: Medicaid Other | Admitting: Obstetrics and Gynecology

## 2020-11-06 NOTE — Telephone Encounter (Signed)
Transition Care Management Unsuccessful Follow-up Telephone Call  Date of discharge and from where:  8/9/20022-[place] [facility]  Attempts:  2nd Attempt  Reason for unsuccessful TCM follow-up call:  Unable to reach patient

## 2020-11-07 NOTE — Telephone Encounter (Signed)
Transition Care Management Unsuccessful Follow-up Telephone Call  Date of discharge and from where:  11/04/2020-[place]  ED   Attempts:  3rd Attempt  Reason for unsuccessful TCM follow-up call:  Unable to reach patient

## 2020-11-26 ENCOUNTER — Encounter: Payer: Self-pay | Admitting: Obstetrics and Gynecology

## 2020-12-25 ENCOUNTER — Ambulatory Visit: Payer: Medicaid Other | Attending: Obstetrics and Gynecology

## 2021-01-21 ENCOUNTER — Ambulatory Visit: Payer: No Typology Code available for payment source

## 2021-02-04 ENCOUNTER — Other Ambulatory Visit: Payer: Self-pay | Admitting: Obstetrics and Gynecology

## 2021-02-04 ENCOUNTER — Ambulatory Visit: Payer: Medicaid Other

## 2021-02-04 DIAGNOSIS — O99212 Obesity complicating pregnancy, second trimester: Secondary | ICD-10-CM

## 2021-02-04 DIAGNOSIS — Z3400 Encounter for supervision of normal first pregnancy, unspecified trimester: Secondary | ICD-10-CM

## 2021-02-04 DIAGNOSIS — D573 Sickle-cell trait: Secondary | ICD-10-CM

## 2021-02-04 DIAGNOSIS — D563 Thalassemia minor: Secondary | ICD-10-CM

## 2021-02-04 DIAGNOSIS — O99019 Anemia complicating pregnancy, unspecified trimester: Secondary | ICD-10-CM

## 2021-02-10 ENCOUNTER — Other Ambulatory Visit: Payer: Self-pay

## 2021-02-10 ENCOUNTER — Inpatient Hospital Stay (HOSPITAL_COMMUNITY)
Admission: AD | Admit: 2021-02-10 | Discharge: 2021-02-10 | Disposition: A | Discharge status: Home / Self Care | Payer: Medicaid Other | Attending: Obstetrics & Gynecology | Admitting: Obstetrics & Gynecology

## 2021-02-10 DIAGNOSIS — Z3400 Encounter for supervision of normal first pregnancy, unspecified trimester: Secondary | ICD-10-CM

## 2021-02-10 DIAGNOSIS — Z3402 Encounter for supervision of normal first pregnancy, second trimester: Secondary | ICD-10-CM | POA: Insufficient documentation

## 2021-02-10 DIAGNOSIS — Z3A25 25 weeks gestation of pregnancy: Secondary | ICD-10-CM

## 2021-02-10 NOTE — MAU Note (Signed)
Presents stating she wants to check on the baby secondary not going to appointments d/t transportation issues.  Denies abdominal pain/cramping, VB and LOF.  Endorses +FM.

## 2021-02-10 NOTE — MAU Provider Note (Signed)
History     CSN: [id number]  Arrival date and time: 02/10/21 1615   Chief Complaint  Patient presents with   Check up   20 y.o. G1 @25 .5 wks presenting for check up. Has no complaits today but hasn't been seen since August d/t transportation issues.    OB History     Gravida  1   Para      Term      Preterm      AB      Living         SAB      IAB      Ectopic      Multiple      Live Births              Past Medical History:  Diagnosis Date   Medical history non-contributory    Sickle cell trait (HCC) 07/16/2013    Past Surgical History:  Procedure Laterality Date   NO PAST SURGERIES      Family History  Problem Relation Age of Onset   Asthma Neg Hx    Eczema Neg Hx     Social History   Tobacco Use   Smoking status: Never    Passive exposure: Never   Smokeless tobacco: Never  Vaping Use   Vaping Use: Never used  Substance Use Topics   Alcohol use: Never    Alcohol/week: 0.0 standard drinks   Drug use: Never    Allergies: No Known Allergies  Medications Prior to Admission  Medication Sig Dispense Refill Last Dose   Blood Pressure Monitoring (BLOOD PRESSURE MONITOR AUTOMAT) DEVI 1 Device by Does not apply route daily. Automatic blood pressure cuff regular size. To monitor blood pressure regularly at home. ICD-10 code:Z34.90 1 each 0    Misc. Devices (GOJJI WEIGHT SCALE) MISC 1 Device by Does not apply route daily as needed. To weight self daily as needed at home. ICD-10 code: Z34.90 1 each 0    Prenatal 27-1 MG TABS Take 1 tablet by mouth daily. 30 tablet 12    promethazine (PHENERGAN) 25 MG tablet Take 1 tablet (25 mg total) by mouth every 6 (six) hours as needed for nausea or vomiting. 30 tablet 1     Review of Systems  Gastrointestinal:  Negative for abdominal pain.  Genitourinary:  Negative for vaginal bleeding and vaginal discharge.  Physical Exam   Blood pressure 120/65, pulse 86, temperature 97.7 F (36.5 C),  temperature source Oral, resp. rate 18, height 4\' 11"  (1.499 m), weight 83.2 kg, last menstrual period 08/14/2020, SpO2 98 %.  Physical Exam Vitals and nursing note reviewed.  Constitutional:      General: She is not in acute distress.    Appearance: Normal appearance.  HENT:     Head: Normocephalic and atraumatic.  Cardiovascular:     Rate and Rhythm: Normal rate.  Pulmonary:     Effort: Pulmonary effort is normal. No respiratory distress.  Musculoskeletal:        General: Normal range of motion.     Cervical back: Normal range of motion.  Neurological:     General: No focal deficit present.     Mental Status: She is alert and oriented to person, place, and time.  Psychiatric:        Mood and Affect: Mood normal.  FHT 132  No results found for this or any previous visit (from the past 24 hour(s)).  MAU Course  Procedures  MDM No emergency identified.  Pt reassured with normal FHT. Pt prefers to transfer prenatal care to [facility], may be able to arrange transportation- message sent to clinic. Stable for discharge home.   Assessment and Plan   1. Supervision of normal first pregnancy, antepartum   2. [redacted] weeks gestation of pregnancy    Discharge home Follow up at [facility] message sent to schedule appt Return precautions  Allergies as of 02/10/2021   No Known Allergies      Medication List     TAKE these medications    Blood Pressure Monitor Automat Devi 1 Device by Does not apply route daily. Automatic blood pressure cuff regular size. To monitor blood pressure regularly at home. ICD-10 code:Z34.90   Gojji Weight Scale Misc 1 Device by Does not apply route daily as needed. To weight self daily as needed at home. ICD-10 code: Z34.90   Prenatal 27-1 MG Tabs Take 1 tablet by mouth daily.   promethazine 25 MG tablet Commonly known as: PHENERGAN Take 1 tablet (25 mg total) by mouth every 6 (six) hours as needed for nausea or vomiting.        Donette Larry,  CNM 02/10/2021, 5:36 PM

## 2021-02-13 DIAGNOSIS — Z1152 Encounter for screening for COVID-19: Secondary | ICD-10-CM | POA: Diagnosis not present

## 2021-02-26 ENCOUNTER — Ambulatory Visit: Payer: No Typology Code available for payment source

## 2021-02-26 ENCOUNTER — Ambulatory Visit: Payer: No Typology Code available for payment source | Attending: Obstetrics and Gynecology

## 2021-03-12 ENCOUNTER — Other Ambulatory Visit: Payer: Self-pay

## 2021-03-12 ENCOUNTER — Encounter: Payer: Self-pay | Admitting: Student

## 2021-03-12 ENCOUNTER — Ambulatory Visit (INDEPENDENT_AMBULATORY_CARE_PROVIDER_SITE_OTHER): Payer: Medicaid Other | Admitting: Student

## 2021-03-12 VITALS — BP 117/75 | HR 90 | Wt 180.2 lb

## 2021-03-12 DIAGNOSIS — Z23 Encounter for immunization: Secondary | ICD-10-CM

## 2021-03-12 DIAGNOSIS — Z3A30 30 weeks gestation of pregnancy: Secondary | ICD-10-CM

## 2021-03-12 DIAGNOSIS — R8271 Bacteriuria: Secondary | ICD-10-CM | POA: Insufficient documentation

## 2021-03-12 DIAGNOSIS — Z3400 Encounter for supervision of normal first pregnancy, unspecified trimester: Secondary | ICD-10-CM | POA: Diagnosis not present

## 2021-03-12 DIAGNOSIS — O99820 Streptococcus B carrier state complicating pregnancy: Secondary | ICD-10-CM | POA: Diagnosis not present

## 2021-03-12 NOTE — Progress Notes (Signed)
°  Subjective:    Judy Underwood is being seen today for her first obstetrical visit.  This is not a planned pregnancy. She is at [redacted]w[redacted]d gestation. Her obstetrical history is significant for  late to prenatal care . Relationship with FOB: significant other, living together. Patient does not intend to breast feed. Pregnancy history fully reviewed.  Patient reports no complaints.  Review of Systems:   Review of Systems  Constitutional: Negative.   HENT: Negative.    Eyes: Negative.   Respiratory: Negative.    Cardiovascular: Negative.   Endocrine: Negative.   Genitourinary: Negative.   Neurological: Negative.   Hematological: Negative.    Objective:     BP 117/75    Pulse 90    Wt 180 lb 3.2 oz (81.7 kg)    LMP 08/14/2020    BMI 36.40 kg/m  Physical Exam Constitutional:      Appearance: Normal appearance.  HENT:     Head: Normocephalic.  Cardiovascular:     Pulses: Normal pulses.  Pulmonary:     Effort: Pulmonary effort is normal.  Skin:    General: Skin is warm.     Capillary Refill: Capillary refill takes less than 2 seconds.  Neurological:     General: No focal deficit present.     Mental Status: She is alert and oriented to person, place, and time.    Exam    Assessment:    Pregnancy: G1P0 Patient Active Problem List   Diagnosis Date Noted   Supervision of normal first pregnancy, antepartum 10/27/2020   Breast lump on left side at 4 o'clock position 06/15/2018   Heavy menses 02/01/2014   Psychosocial stressors 07/16/2013       Plan:     Initial labs drawn. Prenatal vitamins. Problem list reviewed and updated. AFP3 discussed:  too late . Role of ultrasound in pregnancy discussed; fetal survey: ordered. Amniocentesis discussed: not indicated. Follow up in 2 weeks. 75% of 30 min visit spent on counseling and coordination of care.  -welcomed patient to practice -scheduled patient for Ultrasound and follow up appt with 2 hour GTT, patient will do the 2  hour GTT at MFM visit next week -Reviewed instructions and discussed how to prepare for the test; patient verbalized understanding -Patient has signed transportation waiver -Discussed birth control, patient is not sure what she wants to do.  Judy Underwood 03/12/2021

## 2021-03-12 NOTE — Progress Notes (Signed)
Patient ultrasound scheduled for 03/19/21 at 8 AM. Patient notified.

## 2021-03-13 LAB — CBC
Hematocrit: 35.3 % (ref 34.0–46.6)
Hemoglobin: 11.2 g/dL (ref 11.1–15.9)
MCH: 26.7 pg (ref 26.6–33.0)
MCHC: 31.7 g/dL (ref 31.5–35.7)
MCV: 84 fL (ref 79–97)
Platelets: 311 10*3/uL (ref 150–450)
RBC: 4.19 x10E6/uL (ref 3.77–5.28)
RDW: 13.7 % (ref 11.7–15.4)
WBC: 9.8 10*3/uL (ref 3.4–10.8)

## 2021-03-13 LAB — RPR: RPR Ser Ql: NONREACTIVE

## 2021-03-13 LAB — HIV ANTIBODY (ROUTINE TESTING W REFLEX): HIV Screen 4th Generation wRfx: NONREACTIVE

## 2021-03-16 ENCOUNTER — Other Ambulatory Visit: Payer: Self-pay | Admitting: *Deleted

## 2021-03-16 DIAGNOSIS — Z3400 Encounter for supervision of normal first pregnancy, unspecified trimester: Secondary | ICD-10-CM

## 2021-03-19 ENCOUNTER — Other Ambulatory Visit: Payer: Self-pay

## 2021-03-19 ENCOUNTER — Other Ambulatory Visit: Payer: Self-pay | Admitting: *Deleted

## 2021-03-19 ENCOUNTER — Ambulatory Visit: Payer: Medicaid Other | Attending: Student

## 2021-03-19 ENCOUNTER — Other Ambulatory Visit: Payer: Medicaid Other

## 2021-03-19 DIAGNOSIS — Z363 Encounter for antenatal screening for malformations: Secondary | ICD-10-CM | POA: Insufficient documentation

## 2021-03-19 DIAGNOSIS — Z3400 Encounter for supervision of normal first pregnancy, unspecified trimester: Secondary | ICD-10-CM

## 2021-03-19 DIAGNOSIS — Z362 Encounter for other antenatal screening follow-up: Secondary | ICD-10-CM

## 2021-03-19 DIAGNOSIS — Z862 Personal history of diseases of the blood and blood-forming organs and certain disorders involving the immune mechanism: Secondary | ICD-10-CM | POA: Diagnosis not present

## 2021-03-20 ENCOUNTER — Encounter: Payer: Self-pay | Admitting: Student

## 2021-03-20 LAB — CBC
Hematocrit: 34.5 % (ref 34.0–46.6)
Hemoglobin: 11.1 g/dL (ref 11.1–15.9)
MCH: 27.2 pg (ref 26.6–33.0)
MCHC: 32.2 g/dL (ref 31.5–35.7)
MCV: 85 fL (ref 79–97)
Platelets: 279 10*3/uL (ref 150–450)
RBC: 4.08 x10E6/uL (ref 3.77–5.28)
RDW: 13.6 % (ref 11.7–15.4)
WBC: 8.7 10*3/uL (ref 3.4–10.8)

## 2021-03-20 LAB — GLUCOSE TOLERANCE, 2 HOURS W/ 1HR
Glucose, 1 hour: 151 mg/dL (ref 70–179)
Glucose, 2 hour: 105 mg/dL (ref 70–152)
Glucose, Fasting: 85 mg/dL (ref 70–91)

## 2021-03-20 LAB — RPR: RPR Ser Ql: NONREACTIVE

## 2021-03-20 LAB — HIV ANTIBODY (ROUTINE TESTING W REFLEX): HIV Screen 4th Generation wRfx: NONREACTIVE

## 2021-03-22 ENCOUNTER — Encounter (HOSPITAL_COMMUNITY): Payer: Self-pay | Admitting: Obstetrics and Gynecology

## 2021-03-22 ENCOUNTER — Other Ambulatory Visit: Payer: Self-pay

## 2021-03-22 ENCOUNTER — Inpatient Hospital Stay (HOSPITAL_COMMUNITY)
Admission: AD | Admit: 2021-03-22 | Discharge: 2021-03-22 | Disposition: A | Discharge status: Home / Self Care | Payer: Medicaid Other | Attending: Obstetrics and Gynecology | Admitting: Obstetrics and Gynecology

## 2021-03-22 DIAGNOSIS — R8271 Bacteriuria: Secondary | ICD-10-CM | POA: Diagnosis present

## 2021-03-22 DIAGNOSIS — Z20822 Contact with and (suspected) exposure to covid-19: Secondary | ICD-10-CM | POA: Diagnosis not present

## 2021-03-22 DIAGNOSIS — N3090 Cystitis, unspecified without hematuria: Secondary | ICD-10-CM

## 2021-03-22 DIAGNOSIS — R109 Unspecified abdominal pain: Secondary | ICD-10-CM | POA: Insufficient documentation

## 2021-03-22 DIAGNOSIS — B349 Viral infection, unspecified: Secondary | ICD-10-CM

## 2021-03-22 DIAGNOSIS — Z3400 Encounter for supervision of normal first pregnancy, unspecified trimester: Secondary | ICD-10-CM

## 2021-03-22 DIAGNOSIS — M549 Dorsalgia, unspecified: Secondary | ICD-10-CM | POA: Diagnosis not present

## 2021-03-22 DIAGNOSIS — O26899 Other specified pregnancy related conditions, unspecified trimester: Secondary | ICD-10-CM | POA: Diagnosis present

## 2021-03-22 DIAGNOSIS — R0989 Other specified symptoms and signs involving the circulatory and respiratory systems: Secondary | ICD-10-CM | POA: Insufficient documentation

## 2021-03-22 DIAGNOSIS — R197 Diarrhea, unspecified: Secondary | ICD-10-CM | POA: Insufficient documentation

## 2021-03-22 DIAGNOSIS — O219 Vomiting of pregnancy, unspecified: Secondary | ICD-10-CM | POA: Insufficient documentation

## 2021-03-22 DIAGNOSIS — R1111 Vomiting without nausea: Secondary | ICD-10-CM | POA: Diagnosis not present

## 2021-03-22 DIAGNOSIS — M545 Low back pain, unspecified: Secondary | ICD-10-CM | POA: Diagnosis not present

## 2021-03-22 LAB — URINALYSIS, ROUTINE W REFLEX MICROSCOPIC
Bilirubin Urine: NEGATIVE
Glucose, UA: NEGATIVE mg/dL
Ketones, ur: 40 mg/dL — AB
Nitrite: NEGATIVE
Protein, ur: NEGATIVE mg/dL
Specific Gravity, Urine: 1.025 (ref 1.005–1.030)
pH: 7 (ref 5.0–8.0)

## 2021-03-22 LAB — WET PREP, GENITAL
Clue Cells Wet Prep HPF POC: NONE SEEN
Sperm: NONE SEEN
Trich, Wet Prep: NONE SEEN
WBC, Wet Prep HPF POC: >=10 — AB (ref <10)
Yeast Wet Prep HPF POC: NONE SEEN

## 2021-03-22 LAB — RESP PANEL BY RT-PCR (FLU A&B, COVID) ARPGX2
Influenza A by PCR: NEGATIVE
Influenza B by PCR: NEGATIVE
SARS Coronavirus 2 by RT PCR: NEGATIVE

## 2021-03-22 LAB — URINALYSIS, MICROSCOPIC (REFLEX)

## 2021-03-22 MED ORDER — ONDANSETRON HCL 4 MG PO TABS: 4.0000 mg | ORAL_TABLET | Freq: Two times a day (BID) | ORAL | 0 refills | Status: AC | PRN

## 2021-03-22 MED ORDER — ACETAMINOPHEN 500 MG PO TABS: 1000.0000 mg | ORAL_TABLET | Freq: Four times a day (QID) | ORAL | 0 refills | Status: AC | PRN

## 2021-03-22 MED ORDER — CEFADROXIL 500 MG PO CAPS
1000.0000 mg | ORAL_CAPSULE | Freq: Every day | ORAL | Status: DC
  Administered 2021-03-22: 19:00:00 1000 mg via ORAL
  Filled 2021-03-22: qty 2

## 2021-03-22 MED ORDER — LACTATED RINGERS IV BOLUS
1000.0000 mL | Freq: Once | INTRAVENOUS | Status: AC
  Administered 2021-03-22: 16:00:00 1000 mL via INTRAVENOUS

## 2021-03-22 MED ORDER — ONDANSETRON 4 MG PO TBDP
4.0000 mg | ORAL_TABLET | Freq: Once | ORAL | Status: AC
  Administered 2021-03-22: 16:00:00 4 mg via ORAL
  Filled 2021-03-22: qty 1

## 2021-03-22 MED ORDER — ACETAMINOPHEN 325 MG PO TABS
650.0000 mg | ORAL_TABLET | Freq: Once | ORAL | Status: AC
  Administered 2021-03-22: 17:00:00 650 mg via ORAL
  Filled 2021-03-22: qty 2

## 2021-03-22 MED ORDER — CEFADROXIL 500 MG PO CAPS: 1000.0000 mg | ORAL_CAPSULE | Freq: Every day | ORAL | 0 refills | Status: AC

## 2021-03-22 NOTE — MAU Note (Signed)
Patient arrived to [facility] from home complaining of back and abdominal pain that started yesterday 10pm.  Patient is unsure if she is feeling contractions  Patient denies Vaginal bleeding, Leakage of fluid.+ FM reported. patient also reports a cold, runny nose and feeling feverish. She stated that she has not been feeling well for 3 months,

## 2021-03-22 NOTE — Discharge Instructions (Addendum)
You came to the MAU because you had lower abdominal and back pain, and nausea/vomiting/and diarrhea. We checked your urine and think you likely have a urinary tract infection so we treated you with a dose of antibiotics and sent some to the pharmacy ([facility] on [address]) for you to take for the next 10 days. We also gave you tylenol, IV fluids, and nausea medications and you felt much better.  We checked your cervix and it was closed so we don't think you are in preterm labor.  We sent a COVID and flu test and they were negative  Please seek medical care if you have a fever that won't get better with Tylenol, bright red vaginal bleeding, leaking of fluid, or don't feel your baby move as much as normal.

## 2021-03-22 NOTE — MAU Provider Note (Signed)
History     CSN: [id number]  Arrival date and time: 03/22/21 1455   Event Date/Time   First Provider Initiated Contact with Patient 03/22/21 1532      Chief Complaint  Patient presents with   Abdominal Pain   Back Pain   Emesis   HPI #Low back pain #Abdominal pain - since last night - intermittent - sometimes feeling llike have fever - runny nose off and on for 3 months - this time started 2 weeks ago - nauseous and diarrhea since today - vomit x3 (food and water, no blood in vomit) - BM x2 (watery) - no sick contacts - no recent travel - Did COVID vaccine but no booster - got flu shot -  1 hr ago had water and crackers - vomited after - has not had any other fluids to drink today  Denies vaginal bleeding, denies vaginal discharge, denies dysuria. Reports last took tylenol 2 hours ago and think she took two pills.    OB History     Gravida  1   Para      Term      Preterm      AB      Living         SAB      IAB      Ectopic      Multiple      Live Births              Past Medical History:  Diagnosis Date   Medical history non-contributory    Sickle cell trait (HCC) 07/16/2013    Past Surgical History:  Procedure Laterality Date   NO PAST SURGERIES      Family History  Problem Relation Age of Onset   Asthma Neg Hx    Eczema Neg Hx     Social History   Tobacco Use   Smoking status: Never    Passive exposure: Never   Smokeless tobacco: Never  Vaping Use   Vaping Use: Never used  Substance Use Topics   Alcohol use: Never    Alcohol/week: 0.0 standard drinks   Drug use: Never    Allergies: No Known Allergies  Medications Prior to Admission  Medication Sig Dispense Refill Last Dose   Blood Pressure Monitoring (BLOOD PRESSURE MONITOR AUTOMAT) DEVI 1 Device by Does not apply route daily. Automatic blood pressure cuff regular size. To monitor blood pressure regularly at home. ICD-10 code:Z34.90 (Patient not taking:  Reported on 03/12/2021) 1 each 0    Misc. Devices (GOJJI WEIGHT SCALE) MISC 1 Device by Does not apply route daily as needed. To weight self daily as needed at home. ICD-10 code: Z34.90 (Patient not taking: Reported on 03/12/2021) 1 each 0    Prenatal 27-1 MG TABS Take 1 tablet by mouth daily. 30 tablet 12    promethazine (PHENERGAN) 25 MG tablet Take 1 tablet (25 mg total) by mouth every 6 (six) hours as needed for nausea or vomiting. (Patient not taking: Reported on 03/12/2021) 30 tablet 1     Review of Systems  Constitutional:  Positive for fatigue and fever. Negative for chills and diaphoresis.  HENT:  Positive for congestion.   Respiratory:  Negative for shortness of breath and wheezing.   Cardiovascular:  Negative for chest pain.  Gastrointestinal:  Positive for abdominal pain, diarrhea, nausea and vomiting.  Endocrine: Negative for polyuria.  Genitourinary:  Negative for dysuria and hematuria.  Musculoskeletal:  Positive for arthralgias and back pain.  Skin:  Negative for rash.  Neurological:  Negative for light-headedness and headaches.  Physical Exam   Blood pressure (!) 101/59, pulse (!) 109, temperature 97.6 F (36.4 C), temperature source Oral, resp. rate 18, height 4\' 11"  (1.499 m), weight 83.9 kg, last menstrual period 08/14/2020, SpO2 100 %.  Physical Exam Vitals and nursing note reviewed.  Constitutional:      Comments: Appears uncomfortable  Cardiovascular:     Comments: Mild tachycardia Pulmonary:     Effort: Pulmonary effort is normal.  Abdominal:     Tenderness: There is no right CVA tenderness, left CVA tenderness, guarding or rebound.     Comments: Gravid, mild suprapubic tenderness  Skin:    General: Skin is warm.     Capillary Refill: Capillary refill takes less than 2 seconds.  Neurological:     General: No focal deficit present.     Mental Status: She is alert.    MAU Course  Procedures  NST HR: 140s, +accels (had > 3 10x10 accels), no decels No  contractions on monitor Reactive NST for gestational age   MDM Moderate - level 4  Assessment and Plan   Abdominal pain Patient with intermittent lower abdominal pain starting ~12 hours ago, no vaginal bleeding, no discharge and no urinary symptoms.  DDX include preterm contractions (possibly in setting of viral infection) vs. UTI vs. Vaginal infections. Patient with hx of Chlamydia in current pregnancy (09/20/2020) s/p treatment and neg TOC on 10/18/2020. Not contracting on monitor. - Wet mount and GC/CT - UA and urine culture - will give IV fluids   2. Upper respiratory symptoms 3.  Nausea/vomiting/diarrhea with concern for dehydration Patient appears uncomfortable but not toxic. Coughed once during evaluation. Vital signs with mild tachycardia, otherwise stable with normal O2 saturation. Likely viral syndrome. On exam, lungs clear, minor non-productive cough (very occasionally).  - flu swab and COVID swab - Zofran ODT - IV LR bolus    5:03 PM Reports she feels better. Nausea improved. Still feeling some intermittent cramping of lower abdomen. On exam mild TTP of suprapubic area. No fevers (subjective and also Temp 97.6) and no CVA tenderness at this time therefore at this time low suspicion for pyelonephritis. - UA with leuks, bacteria, but no nitrates. However given suprapubic TTP will treat for UTI with Duricef x 7 days - follow up urine culture  Cervix closed on vaginal exam. No contractions on monitor. Some irritability noted. Therefore preterm labor as cause of lower abdominal pain ruled out at this time.   6:16 PM Patient feels better after her IV fluids and Zofran. Currently no longer having abdominal pain and no longer nauseous. COVID and flu test negative. Likely has other viral syndrome and also possibly cystitis given suprapubic tenderness as well as bacteria and leuks in urine.  - Duricef dose given here and total 7 day course sent to pharmacy - Tylenol and Zofran sent  to pharmacy - return precautions discussed  Renard Matter, MD, MPH OB Fellow, Faculty Practice

## 2021-03-24 LAB — CULTURE, OB URINE: Culture: >=100000 — AB

## 2021-03-24 LAB — GC/CHLAMYDIA PROBE AMP (~~LOC~~) NOT AT ARMC
Chlamydia: NEGATIVE
Comment: NEGATIVE
Comment: NORMAL
Neisseria Gonorrhea: NEGATIVE

## 2021-03-29 NOTE — L&D Delivery Note (Signed)
OB/GYN Faculty Practice Delivery Note Judy Underwood is a 21 y.o. G1P1001 s/p SVD at [redacted]w[redacted]d. She was admitted for gHTN.   ROM: 05/20/2021 @ 0530 with clear fluid GBS Status: positive Maximum Maternal Temperature: 102.1  Labor Progress: Pt presented for IOL, SROM early morning with clear fluid.  Augmentation with pitocin.   Delivery Date/Time: 05/20/2021 @ [phone]  Delivery: In the room to exam patient, found to be complete and 2+. After a 5 minutes second stage labor head delivered LOA over intact perineum. No nuchal cord present. Shoulder and body delivered in usual fashion. Infant with spontaneous cry, placed on mother's abdomen, dried and stimulated. Cord clamped x 2 after 1-minute delay, and cut by FOB. Cord blood drawn. The placenta separated spontaneously and delivered via controlled cord traction and maternal pushing effort.  It was inspected and appears to be intact with a 3 VC.  Labia, perineum, vagina, and cervix inspected inspected with posterior periurethral laceration, repaired with 3.O vicryl in usual fashion.  After repair, continued bright red trickling, uterus firm at umbilicus with , start TXA per protocol x 1 dose.   Placenta: 3 VC, intact, to L&D Complications: Covid positive Lacerations: posterior periurethral  EBL: 200 Analgesia: Epidural  Postpartum Planning Mom to postpartum.  Baby to Couplet care / Skin to Skin. [ ]  message to sent to schedule follow-up  [ ]  vaccines UTD  Infant: Female   APGARs 9/9   3445 g   , Student-MidWife

## 2021-03-31 ENCOUNTER — Telehealth: Payer: Self-pay | Admitting: Lactation Services

## 2021-03-31 NOTE — Telephone Encounter (Signed)
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   Reviewed that it is recommended she call [facility] at [phone] to set up a Telephone Genetic Counseling Session to discuss results and recommendations.   Reviewed it is recommended that FOB also be tested to see if he carries the same gene. Reviewed we have the saliva kits in the office she can pick up at her next visit.

## 2021-04-01 ENCOUNTER — Encounter: Payer: Self-pay | Admitting: Certified Nurse Midwife

## 2021-04-01 ENCOUNTER — Other Ambulatory Visit: Payer: Self-pay

## 2021-04-01 ENCOUNTER — Ambulatory Visit (INDEPENDENT_AMBULATORY_CARE_PROVIDER_SITE_OTHER): Payer: Medicaid Other | Admitting: Certified Nurse Midwife

## 2021-04-01 VITALS — BP 126/78 | HR 93 | Wt 184.2 lb

## 2021-04-01 DIAGNOSIS — Z3493 Encounter for supervision of normal pregnancy, unspecified, third trimester: Secondary | ICD-10-CM | POA: Diagnosis not present

## 2021-04-01 DIAGNOSIS — Z3A32 32 weeks gestation of pregnancy: Secondary | ICD-10-CM

## 2021-04-01 DIAGNOSIS — R8271 Bacteriuria: Secondary | ICD-10-CM | POA: Diagnosis not present

## 2021-04-01 NOTE — Progress Notes (Signed)
° °  PRENATAL VISIT NOTE  Subjective:  Judy Underwood is a 21 y.o. G1P0 at 103w6d being seen today for ongoing prenatal care.  She is currently monitored for the following issues for this low-risk pregnancy and has Psychosocial stressors; Supervision of normal first pregnancy, antepartum; and GBS bacteriuria on their problem list.  Patient reports no complaints.  Contractions: Irritability. Vag. Bleeding: None.  Movement: Present. Denies leaking of fluid.   The following portions of the patient's history were reviewed and updated as appropriate: allergies, current medications, past family history, past medical history, past social history, past surgical history and problem list.   Objective:   Vitals:   04/01/21 1349  BP: 126/78  Pulse: 93  Weight: 184 lb 3.2 oz (83.6 kg)    Fetal Status: Fetal Heart Rate (bpm): 146 Fundal Height: 32 cm Movement: Present     General:  Alert, oriented and cooperative. Patient is in no acute distress.  Skin: Skin is warm and dry. No rash noted.   Cardiovascular: Normal heart rate noted  Respiratory: Normal respiratory effort, no problems with respiration noted  Abdomen: Soft, gravid, appropriate for gestational age.  Pain/Pressure: Present     Pelvic: Cervical exam deferred        Extremities: Normal range of motion.  Edema: Trace  Mental Status: Normal mood and affect. Normal behavior. Normal judgment and thought content.   Assessment and Plan:  Pregnancy: G1P0 at [redacted]w[redacted]d 1. Supervision of low-risk pregnancy, third trimester - Doing well, feeling regular and vigorous fetal movement   2. [redacted] weeks gestation of pregnancy - Routine OB care   3. GBS bacteriuria - Will treat in labor  Preterm labor symptoms and general obstetric precautions including but not limited to vaginal bleeding, contractions, leaking of fluid and fetal movement were reviewed in detail with the patient. Please refer to After Visit Summary for other counseling recommendations.    Return in about 2 weeks (around 04/15/2021) for IN-PERSON, LOB.  Future Appointments  Date Time Provider Department Center  04/16/2021  1:30 PM [facility] NURSE [facility]  04/16/2021  1:45 PM [facility]    Bernerd Limbo, CNM

## 2021-04-16 ENCOUNTER — Ambulatory Visit: Payer: Medicaid Other | Attending: Obstetrics and Gynecology

## 2021-04-16 ENCOUNTER — Other Ambulatory Visit: Payer: Self-pay

## 2021-04-16 ENCOUNTER — Encounter: Payer: Medicaid Other | Admitting: Obstetrics & Gynecology

## 2021-04-16 ENCOUNTER — Telehealth: Payer: Self-pay

## 2021-04-16 ENCOUNTER — Ambulatory Visit: Payer: Medicaid Other | Admitting: *Deleted

## 2021-04-16 VITALS — BP 119/66 | HR 86

## 2021-04-16 DIAGNOSIS — Z3400 Encounter for supervision of normal first pregnancy, unspecified trimester: Secondary | ICD-10-CM

## 2021-04-16 DIAGNOSIS — O99013 Anemia complicating pregnancy, third trimester: Secondary | ICD-10-CM | POA: Insufficient documentation

## 2021-04-16 DIAGNOSIS — E668 Other obesity: Secondary | ICD-10-CM | POA: Diagnosis not present

## 2021-04-16 DIAGNOSIS — O99213 Obesity complicating pregnancy, third trimester: Secondary | ICD-10-CM | POA: Diagnosis not present

## 2021-04-16 DIAGNOSIS — Z3A35 35 weeks gestation of pregnancy: Secondary | ICD-10-CM | POA: Diagnosis not present

## 2021-04-16 DIAGNOSIS — O0933 Supervision of pregnancy with insufficient antenatal care, third trimester: Secondary | ICD-10-CM | POA: Diagnosis not present

## 2021-04-16 DIAGNOSIS — D573 Sickle-cell trait: Secondary | ICD-10-CM | POA: Diagnosis not present

## 2021-04-16 DIAGNOSIS — Z362 Encounter for other antenatal screening follow-up: Secondary | ICD-10-CM

## 2021-04-16 NOTE — Telephone Encounter (Signed)
Patient called to schedule Transportation - has Medicaid - I explained to the patient that I would set up [facility] transportation for today, however for future appointments she will have to contact Medicaid transportation.  I took a copy of the Medicaid Transportation Information up to the front and ask them to give to patient when she arrives 04/16/21

## 2021-05-04 ENCOUNTER — Encounter: Payer: Self-pay | Admitting: Student

## 2021-05-07 ENCOUNTER — Encounter: Payer: Medicaid Other | Admitting: Student

## 2021-05-07 ENCOUNTER — Ambulatory Visit (INDEPENDENT_AMBULATORY_CARE_PROVIDER_SITE_OTHER): Payer: Medicaid Other | Admitting: Student

## 2021-05-07 ENCOUNTER — Other Ambulatory Visit: Payer: Self-pay

## 2021-05-07 ENCOUNTER — Other Ambulatory Visit (HOSPITAL_COMMUNITY)
Admission: RE | Admit: 2021-05-07 | Discharge: 2021-05-07 | Disposition: A | Discharge status: Home / Self Care | Payer: Medicaid Other | Source: Ambulatory Visit | Attending: Obstetrics & Gynecology | Admitting: Obstetrics & Gynecology

## 2021-05-07 VITALS — BP 117/80 | HR 84 | Wt 183.2 lb

## 2021-05-07 DIAGNOSIS — Z3A38 38 weeks gestation of pregnancy: Secondary | ICD-10-CM

## 2021-05-07 DIAGNOSIS — Z3400 Encounter for supervision of normal first pregnancy, unspecified trimester: Secondary | ICD-10-CM | POA: Insufficient documentation

## 2021-05-07 NOTE — Progress Notes (Signed)
° °  PRENATAL VISIT NOTE  Subjective:  Judy Underwood is a 21 y.o. G1P0 at [redacted]w[redacted]d being seen today for ongoing prenatal care.  She is currently monitored for the following issues for this low-risk pregnancy and has Psychosocial stressors; Supervision of normal first pregnancy, antepartum; and GBS bacteriuria on their problem list.  Patient reports no complaints. She reports feeling tired.   Contractions: Not present. Vag. Bleeding: None.  Movement: Present. Denies leaking of fluid.   The following portions of the patient's history were reviewed and updated as appropriate: allergies, current medications, past family history, past medical history, past social history, past surgical history and problem list.   Objective:   Vitals:   05/07/21 1025  BP: 117/80  Pulse: 84  Weight: 183 lb 3.2 oz (83.1 kg)    Fetal Status: Fetal Heart Rate (bpm): 134 Fundal Height: 38 cm Movement: Present     General:  Alert, oriented and cooperative. Patient is in no acute distress.  Skin: Skin is warm and dry. No rash noted.   Cardiovascular: Normal heart rate noted  Respiratory: Normal respiratory effort, no problems with respiration noted  Abdomen: Soft, gravid, appropriate for gestational age.  Pain/Pressure: Present     Pelvic: Cervical exam deferred        Extremities: Normal range of motion.  Edema: None  Mental Status: Normal mood and affect. Normal behavior. Normal judgment and thought content.   Assessment and Plan:  Pregnancy: G1P0 at [redacted]w[redacted]d 1. Supervision of normal first pregnancy, antepartum -[facility] resources [facility]  -she needs bassinet and clothes; does not have car seat either ; referred to social work who can provide pack and play -will consider IOL at 40 weeks based on how patient is feeling with her social situation - GC/Chlamydia probe amp ([place])not at [facility] -she would like to do pills for birth control Term labor symptoms and general obstetric precautions  including but not limited to vaginal bleeding, contractions, leaking of fluid and fetal movement were reviewed in detail with the patient. Please refer to After Visit Summary for other counseling recommendations.   Return in about 1 week (around 05/14/2021), or My chart visit with CNM.  No future appointments.  Marylene Land, CNM

## 2021-05-08 LAB — GC/CHLAMYDIA PROBE AMP (~~LOC~~) NOT AT ARMC
Chlamydia: NEGATIVE
Comment: NEGATIVE
Comment: NORMAL
Neisseria Gonorrhea: NEGATIVE

## 2021-05-16 ENCOUNTER — Inpatient Hospital Stay (HOSPITAL_COMMUNITY)
Admission: AD | Admit: 2021-05-16 | Discharge: 2021-05-16 | Disposition: A | Discharge status: Home / Self Care | Payer: Medicaid Other | Attending: Obstetrics and Gynecology | Admitting: Obstetrics and Gynecology

## 2021-05-16 ENCOUNTER — Encounter: Payer: Self-pay | Admitting: Student

## 2021-05-16 ENCOUNTER — Other Ambulatory Visit: Payer: Self-pay

## 2021-05-16 DIAGNOSIS — Z3689 Encounter for other specified antenatal screening: Secondary | ICD-10-CM

## 2021-05-16 DIAGNOSIS — O26893 Other specified pregnancy related conditions, third trimester: Secondary | ICD-10-CM | POA: Diagnosis present

## 2021-05-16 DIAGNOSIS — O471 False labor at or after 37 completed weeks of gestation: Secondary | ICD-10-CM | POA: Insufficient documentation

## 2021-05-16 DIAGNOSIS — O479 False labor, unspecified: Secondary | ICD-10-CM | POA: Diagnosis not present

## 2021-05-16 DIAGNOSIS — Z3A39 39 weeks gestation of pregnancy: Secondary | ICD-10-CM

## 2021-05-16 DIAGNOSIS — I10 Essential (primary) hypertension: Secondary | ICD-10-CM | POA: Diagnosis not present

## 2021-05-16 NOTE — MAU Provider Note (Signed)
S: Ms. Judy Underwood is a 21 y.o. G1P0 at [redacted]w[redacted]d  who presents to MAU today for labor evaluation.   Nurse reports no cervical change after reexamination.  Reports patient with pain 2/10 and good fetal movement.   Cervical exam by RN:  Dilation: 1 Effacement (%): 50 Station: -3 Presentation: Vertex Exam by:: Ronney Lion RN  Fetal Monitoring: Baseline: 135 Variability: Moderate Accelerations: Present Decelerations: Absent Contractions: Q1-66min  MDM Discussed patient with RN. NST reviewed.   A: SIUP at [redacted]w[redacted]d  False labor Cat I FT  P: NST Reactive Discharge home Labor precautions and kick counts included in AVS Patient to follow-up with primary office as scheduled  Patient may return to MAU as needed or when in labor   Gerrit Heck, [place] 05/16/2021 2:14 AM

## 2021-05-16 NOTE — MAU Note (Addendum)
Pt reports to MAU by EMS with c/o ctx every 2 mins since 2300. Pt denies LOF and VB. Reports +FM. Pain 10/10 with ctx.

## 2021-05-19 ENCOUNTER — Telehealth: Payer: Self-pay | Admitting: Lactation Services

## 2021-05-19 ENCOUNTER — Encounter (HOSPITAL_COMMUNITY): Payer: Self-pay | Admitting: Obstetrics & Gynecology

## 2021-05-19 ENCOUNTER — Encounter: Payer: Self-pay | Admitting: Student

## 2021-05-19 ENCOUNTER — Inpatient Hospital Stay (HOSPITAL_COMMUNITY)
Admission: AD | Admit: 2021-05-19 | Discharge: 2021-05-22 | DRG: 805 | Disposition: A | Discharge status: Home / Self Care | Payer: Medicaid Other | Attending: Obstetrics and Gynecology | Admitting: Obstetrics and Gynecology

## 2021-05-19 ENCOUNTER — Other Ambulatory Visit: Payer: Self-pay

## 2021-05-19 ENCOUNTER — Telehealth: Payer: Medicaid Other

## 2021-05-19 DIAGNOSIS — O479 False labor, unspecified: Secondary | ICD-10-CM | POA: Diagnosis not present

## 2021-05-19 DIAGNOSIS — Z3A39 39 weeks gestation of pregnancy: Secondary | ICD-10-CM

## 2021-05-19 DIAGNOSIS — D62 Acute posthemorrhagic anemia: Secondary | ICD-10-CM | POA: Diagnosis not present

## 2021-05-19 DIAGNOSIS — O9081 Anemia of the puerperium: Secondary | ICD-10-CM | POA: Diagnosis not present

## 2021-05-19 DIAGNOSIS — O9852 Other viral diseases complicating childbirth: Secondary | ICD-10-CM | POA: Diagnosis present

## 2021-05-19 DIAGNOSIS — O99824 Streptococcus B carrier state complicating childbirth: Secondary | ICD-10-CM | POA: Diagnosis not present

## 2021-05-19 DIAGNOSIS — D573 Sickle-cell trait: Secondary | ICD-10-CM | POA: Diagnosis not present

## 2021-05-19 DIAGNOSIS — O1414 Severe pre-eclampsia complicating childbirth: Secondary | ICD-10-CM | POA: Diagnosis not present

## 2021-05-19 DIAGNOSIS — O163 Unspecified maternal hypertension, third trimester: Secondary | ICD-10-CM | POA: Diagnosis present

## 2021-05-19 DIAGNOSIS — O99820 Streptococcus B carrier state complicating pregnancy: Secondary | ICD-10-CM | POA: Diagnosis not present

## 2021-05-19 DIAGNOSIS — U071 COVID-19: Secondary | ICD-10-CM | POA: Diagnosis present

## 2021-05-19 DIAGNOSIS — E871 Hypo-osmolality and hyponatremia: Secondary | ICD-10-CM | POA: Diagnosis not present

## 2021-05-19 DIAGNOSIS — R030 Elevated blood-pressure reading, without diagnosis of hypertension: Secondary | ICD-10-CM | POA: Diagnosis not present

## 2021-05-19 DIAGNOSIS — O99284 Endocrine, nutritional and metabolic diseases complicating childbirth: Secondary | ICD-10-CM | POA: Diagnosis present

## 2021-05-19 DIAGNOSIS — Z3400 Encounter for supervision of normal first pregnancy, unspecified trimester: Secondary | ICD-10-CM

## 2021-05-19 LAB — CBC
HCT: 40.4 % (ref 36.0–46.0)
Hemoglobin: 12.6 g/dL (ref 12.0–15.0)
MCH: 27.1 pg (ref 26.0–34.0)
MCHC: 31.2 g/dL (ref 30.0–36.0)
MCV: 86.9 fL (ref 80.0–100.0)
Platelets: 311 10*3/uL (ref 150–400)
RBC: 4.65 MIL/uL (ref 3.87–5.11)
RDW: 14.7 % (ref 11.5–15.5)
WBC: 10.9 10*3/uL — ABNORMAL HIGH (ref 4.0–10.5)
nRBC: 0 % (ref 0.0–0.2)

## 2021-05-19 LAB — PROTEIN / CREATININE RATIO, URINE
Creatinine, Urine: 129.45 mg/dL
Protein Creatinine Ratio: 0.15 mg/mg{Cre} (ref 0.00–0.15)
Total Protein, Urine: 19 mg/dL

## 2021-05-19 LAB — COMPREHENSIVE METABOLIC PANEL
ALT: 105 U/L — ABNORMAL HIGH (ref 0–44)
AST: 72 U/L — ABNORMAL HIGH (ref 15–41)
Albumin: 2.9 g/dL — ABNORMAL LOW (ref 3.5–5.0)
Alkaline Phosphatase: 254 U/L — ABNORMAL HIGH (ref 38–126)
Anion gap: 10 (ref 5–15)
BUN: 6 mg/dL (ref 6–20)
CO2: 21 mmol/L — ABNORMAL LOW (ref 22–32)
Calcium: 9 mg/dL (ref 8.9–10.3)
Chloride: 103 mmol/L (ref 98–111)
Creatinine, Ser: 0.62 mg/dL (ref 0.44–1.00)
GFR, Estimated: >60 mL/min (ref >60)
Glucose, Bld: 86 mg/dL (ref 70–99)
Potassium: 4 mmol/L (ref 3.5–5.1)
Sodium: 134 mmol/L — ABNORMAL LOW (ref 135–145)
Total Bilirubin: 0.8 mg/dL (ref 0.3–1.2)
Total Protein: 6.8 g/dL (ref 6.5–8.1)

## 2021-05-19 LAB — TYPE AND SCREEN
ABO/RH(D): O POS
Antibody Screen: NEGATIVE

## 2021-05-19 LAB — RESP PANEL BY RT-PCR (FLU A&B, COVID) ARPGX2
Influenza A by PCR: NEGATIVE
Influenza B by PCR: NEGATIVE
SARS Coronavirus 2 by RT PCR: POSITIVE — AB

## 2021-05-19 MED ORDER — SODIUM CHLORIDE 0.9 % IV SOLN
5.0000 10*6.[IU] | Freq: Once | INTRAVENOUS | Status: AC
  Administered 2021-05-19: 5 10*6.[IU] via INTRAVENOUS
  Filled 2021-05-19: qty 5

## 2021-05-19 MED ORDER — DIPHENHYDRAMINE HCL 50 MG/ML IJ SOLN: 12.5000 mg | INTRAMUSCULAR | Status: DC | PRN

## 2021-05-19 MED ORDER — FENTANYL CITRATE (PF) 100 MCG/2ML IJ SOLN
100.0000 ug | INTRAMUSCULAR | Status: DC | PRN
  Administered 2021-05-19: 100 ug via INTRAVENOUS
  Filled 2021-05-19: qty 2

## 2021-05-19 MED ORDER — FLEET ENEMA 7-19 GM/118ML RE ENEM: 1.0000 | ENEMA | RECTAL | Status: DC | PRN

## 2021-05-19 MED ORDER — ACETAMINOPHEN 325 MG PO TABS: 650.0000 mg | ORAL_TABLET | ORAL | Status: DC | PRN

## 2021-05-19 MED ORDER — FENTANYL-BUPIVACAINE-NACL 0.5-0.125-0.9 MG/250ML-% EP SOLN
12.0000 mL/h | EPIDURAL | Status: DC | PRN
  Administered 2021-05-20: 12 mL/h via EPIDURAL
  Filled 2021-05-19: qty 250

## 2021-05-19 MED ORDER — OXYTOCIN BOLUS FROM INFUSION
333.0000 mL | Freq: Once | INTRAVENOUS | Status: AC
  Administered 2021-05-20: 333 mL via INTRAVENOUS

## 2021-05-19 MED ORDER — PENICILLIN G POT IN DEXTROSE 60000 UNIT/ML IV SOLN: 3.0000 10*6.[IU] | INTRAVENOUS | Status: DC

## 2021-05-19 MED ORDER — LIDOCAINE HCL (PF) 1 % IJ SOLN: 30.0000 mL | INTRAMUSCULAR | Status: DC | PRN

## 2021-05-19 MED ORDER — SODIUM CHLORIDE 0.9 % IV SOLN: 5.0000 10*6.[IU] | Freq: Once | INTRAVENOUS | Status: DC

## 2021-05-19 MED ORDER — LACTATED RINGERS IV SOLN: 500.0000 mL | INTRAVENOUS | Status: DC | PRN

## 2021-05-19 MED ORDER — OXYCODONE-ACETAMINOPHEN 5-325 MG PO TABS: 2.0000 | ORAL_TABLET | ORAL | Status: DC | PRN

## 2021-05-19 MED ORDER — PHENYLEPHRINE 40 MCG/ML (10ML) SYRINGE FOR IV PUSH (FOR BLOOD PRESSURE SUPPORT)
80.0000 ug | PREFILLED_SYRINGE | INTRAVENOUS | Status: DC | PRN
  Filled 2021-05-19: qty 10

## 2021-05-19 MED ORDER — PHENYLEPHRINE 40 MCG/ML (10ML) SYRINGE FOR IV PUSH (FOR BLOOD PRESSURE SUPPORT)
80.0000 ug | PREFILLED_SYRINGE | INTRAVENOUS | Status: DC | PRN
  Administered 2021-05-20: 80 ug via INTRAVENOUS

## 2021-05-19 MED ORDER — LACTATED RINGERS IV SOLN
500.0000 mL | Freq: Once | INTRAVENOUS | Status: AC
  Administered 2021-05-20: 500 mL via INTRAVENOUS

## 2021-05-19 MED ORDER — EPHEDRINE 5 MG/ML INJ: 10.0000 mg | INTRAVENOUS | Status: DC | PRN

## 2021-05-19 MED ORDER — OXYTOCIN-SODIUM CHLORIDE 30-0.9 UT/500ML-% IV SOLN
2.5000 [IU]/h | INTRAVENOUS | Status: DC
  Filled 2021-05-19: qty 500

## 2021-05-19 MED ORDER — PENICILLIN G POT IN DEXTROSE 60000 UNIT/ML IV SOLN
3.0000 10*6.[IU] | INTRAVENOUS | Status: DC
Start: 2021-05-20 — End: 2021-05-20
  Administered 2021-05-20 (×2): 3 10*6.[IU] via INTRAVENOUS
  Filled 2021-05-19 (×5): qty 50

## 2021-05-19 MED ORDER — ONDANSETRON HCL 4 MG/2ML IJ SOLN: 4.0000 mg | Freq: Four times a day (QID) | INTRAMUSCULAR | Status: DC | PRN

## 2021-05-19 MED ORDER — OXYCODONE-ACETAMINOPHEN 5-325 MG PO TABS: 1.0000 | ORAL_TABLET | ORAL | Status: DC | PRN

## 2021-05-19 MED ORDER — SOD CITRATE-CITRIC ACID 500-334 MG/5ML PO SOLN: 30.0000 mL | ORAL | Status: DC | PRN

## 2021-05-19 MED ORDER — LACTATED RINGERS IV SOLN: INTRAVENOUS | Status: DC

## 2021-05-19 NOTE — H&P (Shared)
OBSTETRIC ADMISSION HISTORY AND PHYSICAL  Judy Underwood is a 21 y.o. female G1P0 with IUP at [redacted]w[redacted]d by LMP presenting for IOL d/t multiple elevated BP readings in the MAU at term. She reports +FMs, No LOF, no VB, no blurry vision, headaches or peripheral edema, and RUQ pain.  She plans on bottle feeding. She requests pills for birth control. She received her prenatal care at [facility]  Dating: By LMP --->  Estimated Date of Delivery: 05/21/21  Sono:    @[redacted]w[redacted]d , CWD, normal anatomy, cephalic presentation,  1985g, 85% EFW  Prenatal History/Complications:  --Late and limited prenatal care (3 visits) d/t limited transportation --GBS Bacteriuria 10/27/2020 --Sickle cell trait  Past Medical History: Past Medical History:  Diagnosis Date   Breast lump on left side at 4 o'clock position 06/15/2018   Medical history non-contributory    Sickle cell trait (HCC) 07/16/2013    Past Surgical History: Past Surgical History:  Procedure Laterality Date   NO PAST SURGERIES      Obstetrical History: OB History     Gravida  1   Para      Term      Preterm      AB      Living         SAB      IAB      Ectopic      Multiple      Live Births              Social History Social History   Socioeconomic History   Marital status: Single    Spouse name: Not on file   Number of children: Not on file   Years of education: Not on file   Highest education level: High school graduate  Occupational History   Occupation: employed  Tobacco Use   Smoking status: Never    Passive exposure: Never   Smokeless tobacco: Never  Vaping Use   Vaping Use: Never used  Substance and Sexual Activity   Alcohol use: Never    Alcohol/week: 0.0 standard drinks   Drug use: Never   Sexual activity: Yes    Comment: Nexplanon removed 02/2020  Other Topics Concern   Not on file  Social History Narrative   Not on file   Social Determinants of Health   Financial Resource Strain: Not on file   Food Insecurity: No Food Insecurity   Worried About 03/2020 in the Last Year: Never true   Programme researcher, broadcasting/film/video in the Last Year: Never true  Transportation Needs: Unmet Transportation Needs   Lack of Transportation (Medical): Yes   Lack of Transportation (Non-Medical): Yes  Physical Activity: Not on file  Stress: Not on file  Social Connections: Not on file    Family History: Family History  Problem Relation Age of Onset   Asthma Neg Hx    Eczema Neg Hx    Diabetes Neg Hx    Hypertension Neg Hx     Allergies: No Known Allergies  Pt denies allergies to latex, iodine, or shellfish.  Medications Prior to Admission  Medication Sig Dispense Refill Last Dose   Prenatal 27-1 MG TABS Take 1 tablet by mouth daily. 30 tablet 12      Review of Systems   All systems reviewed and negative except as stated in HPI  Blood pressure (!) 135/96, pulse 81, temperature (!) 97.5 F (36.4 C), temperature source Oral, resp. rate 12, last menstrual period 08/14/2020, SpO2 100 %.  General appearance: {general exam:16600} Lungs: clear to auscultation bilaterally Heart: regular rate and rhythm Abdomen: soft, non-tender; bowel sounds normal Pelvic: *** Extremities: Homans sign is negative, no sign of DVT DTR's *** Presentation: {desc; fetal presentation:14558} Fetal monitoring: moderate variability, baseline 140s Uterine activity Frequency every 4 minutes Dilation: 2 Effacement (%): 70 Station: -3 Exam by:: Zenia Resides, RN  Prenatal labs: ABO, Rh: O/Positive/-- (08/01 1343) Antibody: Negative (08/01 1343) Rubella: 9.14 (08/01 1343) RPR: Non Reactive (12/22 0912)  HBsAg: Negative (08/01 1343)  HIV: Non Reactive (12/22 0912)  GBS:   Positive cx 10/27/2020 2 hr Glucola: 105 Genetic screening:  Panorama LR female, [facility] silent carrier for alpha thalassemia Anatomy US: Not formally done, no abnormalities seen on u/s during pregnancy  Prenatal Transfer Tool  Maternal Diabetes:  No Genetic Screening: Panorama LR female, [facility] silent carrier for alpha thalassemia Maternal Ultrasounds/Referrals: Normal Fetal Ultrasounds or other Referrals:  Referred to Materal Fetal Medicine  due to limited prenatal care and assessment of fetal growth (normal growth on 1/19) Maternal Substance Abuse:  No Significant Maternal Medications:  None Significant Maternal Lab Results: Group B Strep positive 10/27/2020 cx  No results found for this or any previous visit (from the past 24 hour(s)).  Patient Active Problem List   Diagnosis Date Noted   GBS bacteriuria 03/12/2021   Supervision of normal first pregnancy, antepartum 10/27/2020   Psychosocial stressors 07/16/2013    Assessment/Plan:  Judy Underwood is a 21 y.o. G1P0 at [redacted]w[redacted]d here for IOL d/t elevated BP  #Labor: IOL *** #Pain: *** #FWB: Cat I #ID:  GBS + 10/27/2020 #MOF: Bottle #MOC: Birth control pills #Circ:  N/A  #Elevated BP Has had some elevated BP in MAU. Asymptomatic*** -serial Bps -CMP, CBC, UPC  #GBS Positive cx 10/27/2020 -Penicillin G  Levin Erp, MD  [facility] PGY-1 Center for [facility], [facility] 05/19/2021, 5:00 PM

## 2021-05-19 NOTE — Telephone Encounter (Signed)
Called patient in regards to her My Chart message.   She has been having contractions about every 5 minutes and lasting a few minutes each. they have been occurring since last night. She was tearful on the phone.   She reports she has watery vaginal discharge. Advised patient that she needs to go to the hospital. She reports she does not have anyone who can take her to the hospital. She plans to call EMS to come and get her. Advised to take an overnight bag with her.   Called and spoke with Cleone Slim, CNM to report patient will be coming in via EMS soon.

## 2021-05-19 NOTE — H&P (Addendum)
OBSTETRIC ADMISSION HISTORY AND PHYSICAL  Judy Underwood is a 21 y.o. female G1P0 with IUP at [redacted]w[redacted]d by LMP presenting for IOL d/t multiple elevated BP readings in the MAU at term. She reports +FMs, No LOF, no VB, no blurry vision, headaches or peripheral edema, and RUQ pain. She plans on bottle feeding. Undecided on postpartum contraception. Significant other Severik at bedside.   She received limited prenatal care at [facility]  Dating: By LMP ---> Estimated Date of Delivery: 05/21/21 @[redacted]w[redacted]d , CWD, normal anatomy, cephalic presentation, 1985g, EFW  Prenatal History/Complications:  -Late and limited prenatal care (3 visits) d/t limited transportation -GBS Bacteriuria 10/27/2020 -Sickle cell trait  Past Medical History: Past Medical History:  Diagnosis Date   Breast lump on left side at 4 o'clock position 06/15/2018   Medical history non-contributory    Sickle cell trait (HCC) 07/16/2013    Past Surgical History: Past Surgical History:  Procedure Laterality Date   NO PAST SURGERIES      Obstetrical History: OB History     Gravida  1   Para      Term      Preterm      AB      Living         SAB      IAB      Ectopic      Multiple      Live Births              Social History Social History   Socioeconomic History   Marital status: Single    Spouse name: Not on file   Number of children: Not on file   Years of education: Not on file   Highest education level: High school graduate  Occupational History   Occupation: employed  Tobacco Use   Smoking status: Never    Passive exposure: Never   Smokeless tobacco: Never  Vaping Use   Vaping Use: Never used  Substance and Sexual Activity   Alcohol use: Never    Alcohol/week: 0.0 standard drinks   Drug use: Never   Sexual activity: Yes    Comment: Nexplanon removed 02/2020  Other Topics Concern   Not on file  Social History Narrative   Not on file   Social Determinants of Health   Financial  Resource Strain: Not on file  Food Insecurity: No Food Insecurity   Worried About 03/2020 in the Last Year: Never true   Programme researcher, broadcasting/film/video in the Last Year: Never true  Transportation Needs: Unmet Transportation Needs   Lack of Transportation (Medical): Yes   Lack of Transportation (Non-Medical): Yes  Physical Activity: Not on file  Stress: Not on file  Social Connections: Not on file    Family History: Family History  Problem Relation Age of Onset   Asthma Neg Hx    Eczema Neg Hx    Diabetes Neg Hx    Hypertension Neg Hx     Allergies: No Known Allergies  Pt denies allergies to latex, iodine, or shellfish.  Medications Prior to Admission  Medication Sig Dispense Refill Last Dose   Prenatal 27-1 MG TABS Take 1 tablet by mouth daily. 30 tablet 12      Review of Systems   All systems reviewed and negative except as stated in HPI  Blood pressure 130/74, pulse 74, temperature (!) 97.5 F (36.4 C), temperature source Oral, resp. rate 12, last menstrual period 08/14/2020, SpO2 100 %. General appearance: alert, cooperative,  and appears older than stated age Lungs: clear to auscultation bilaterally Heart: regular rate and rhythm Abdomen: soft, non-tender; bowel sounds normal Extremities: Homans sign is negative, no sign of DVT Presentation: cephalic Fetal monitoring: moderate variability, baseline 140s Uterine activity Frequency every 4 minutes Dilation: 4 Effacement (%): 70 Station: -2 Exam by: Raelyn Number, SNM  Prenatal labs: ABO, Rh: --/--/O POS (02/21 1706) Antibody: NEG (02/21 1706) Rubella: 9.14 (08/01 1343) RPR: Non Reactive (12/22 0912)  HBsAg: Negative (08/01 1343)  HIV: Non Reactive (12/22 0912)  GBS:   Positive cx 10/27/2020 2 hr Glucola: 105 Genetic screening:  Panorama LR female, Horizon silent carrier for alpha thalassemia Anatomy US: Not formally done, no abnormalities seen on u/s during pregnancy  Prenatal Transfer Tool  Maternal  Diabetes: No Genetic Screening: Panorama LR female, Horizon silent carrier for alpha thalassemia Maternal Ultrasounds/Referrals: Normal Fetal Ultrasounds or other Referrals:  Referred to Materal Fetal Medicine  due to limited prenatal care and assessment of fetal growth (normal growth on 1/19) Maternal Substance Abuse:  No Significant Maternal Medications:  None Significant Maternal Lab Results: Group B Strep positive 10/27/2020 cx  Results for orders placed or performed during the hospital encounter of 05/19/21 (from the past 24 hour(s))  Resp Panel by RT-PCR (Flu A&B, Covid) Nasopharyngeal Swab   Collection Time: 05/19/21  5:01 PM   Specimen: Nasopharyngeal Swab; Nasopharyngeal(NP) swabs in vial transport medium  Result Value Ref Range   SARS Coronavirus 2 by RT PCR POSITIVE (A) NEGATIVE   Influenza A by PCR NEGATIVE NEGATIVE   Influenza B by PCR NEGATIVE NEGATIVE  CBC   Collection Time: 05/19/21  5:01 PM  Result Value Ref Range   WBC 10.9 (H) 4.0 - 10.5 K/uL   RBC 4.65 3.87 - 5.11 MIL/uL   Hemoglobin 12.6 12.0 - 15.0 g/dL   HCT 02.3 34.3 - 56.8 %   MCV 86.9 80.0 - 100.0 fL   MCH 27.1 26.0 - 34.0 pg   MCHC 31.2 30.0 - 36.0 g/dL   RDW 61.6 83.7 - 29.0 %   Platelets 311 150 - 400 K/uL   nRBC 0.0 0.0 - 0.2 %  Type and screen [facility]   Collection Time: 05/19/21  5:06 PM  Result Value Ref Range   ABO/RH(D) O POS    Antibody Screen NEG    Sample Expiration      05/22/2021,2359 Performed at [facility], [address] [place], [place] [address]   Comprehensive metabolic panel   Collection Time: 05/19/21  5:07 PM  Result Value Ref Range   Sodium 134 (L) 135 - 145 mmol/L   Potassium 4.0 3.5 - 5.1 mmol/L   Chloride 103 98 - 111 mmol/L   CO2 21 (L) 22 - 32 mmol/L   Glucose, Bld 86 70 - 99 mg/dL   BUN 6 6 - 20 mg/dL   Creatinine, Ser 5.20 0.44 - 1.00 mg/dL   Calcium 9.0 8.9 - 80.2 mg/dL   Total Protein 6.8 6.5 - 8.1 g/dL   Albumin 2.9 (L) 3.5 - 5.0  g/dL   AST 72 (H) 15 - 41 U/L   ALT 105 (H) 0 - 44 U/L   Alkaline Phosphatase 254 (H) 38 - 126 U/L   Total Bilirubin 0.8 0.3 - 1.2 mg/dL   GFR, Estimated >23 >36 mL/min   Anion gap 10 5 - 15  Protein / creatinine ratio, urine   Collection Time: 05/19/21  5:27 PM  Result Value Ref  Range   Creatinine, Urine 129.45 mg/dL   Total Protein, Urine 19 mg/dL   Protein Creatinine Ratio 0.15 0.00 - 0.15 mg/mg[Cre]    Patient Active Problem List   Diagnosis Date Noted   GBS bacteriuria 03/12/2021   Supervision of normal first pregnancy, antepartum 10/27/2020   Psychosocial stressors 07/16/2013    Assessment/Plan:  Ninetta Adelstein is a 21 y.o. G1P0 at [redacted]w[redacted]d here for IOL d/t new dx gHTN, patient asymptomatic.    #Pain: Nitrous and planning epidural later during labor  #FWB: Cat I #ID: GBS +10/27/2020 #MOF: Bottle #MOC: Birth control pills #Circ:  N/A  #Elevated BP Has had some elevated BP in MAU, patient asymptomatic -Serial Bps -CMP, CBC, UPC(0.15) -Elevated liver enzymes (AST-72, ALT-105)   #GBS Positive cx 10/27/2020 -Penicillin G  # Covid Positive  -Asymptomatic   Trellis Moment, Student-MidWife  05/19/2021, 8:59 PM   I spoke with and examined patient and agree with resident/PA-S/MS/SNM's note and plan of care.  In for r/o labor, contractions for few days, worse today. Noted to have new-onsent elevated bp's, asymptomatic. Good fm, denies vb/lof. Cat1FHR, UCs irregular, SVE 2/70/-3 to 4/70/-2 [redacted]w[redacted]d early labor, new GHTN, mildly elevated LFTs otherwise normal pre-e labs, GBS+ Admit, expectant management-augment prn, PCN for GBS+, monitor bp's Cheral Marker, CNM, [facility] 05/19/2021 10:05 PM

## 2021-05-19 NOTE — MAU Note (Signed)
Pt reports ctx's that started last night at 1800.   Denies LOF or vaginal bleeding   Reports +FM

## 2021-05-20 ENCOUNTER — Encounter (HOSPITAL_COMMUNITY): Payer: Self-pay | Admitting: Family Medicine

## 2021-05-20 ENCOUNTER — Inpatient Hospital Stay (HOSPITAL_COMMUNITY): Payer: Medicaid Other | Admitting: Anesthesiology

## 2021-05-20 DIAGNOSIS — O9852 Other viral diseases complicating childbirth: Secondary | ICD-10-CM | POA: Diagnosis not present

## 2021-05-20 DIAGNOSIS — U071 COVID-19: Secondary | ICD-10-CM | POA: Diagnosis present

## 2021-05-20 DIAGNOSIS — O99820 Streptococcus B carrier state complicating pregnancy: Secondary | ICD-10-CM | POA: Diagnosis not present

## 2021-05-20 DIAGNOSIS — O1414 Severe pre-eclampsia complicating childbirth: Secondary | ICD-10-CM | POA: Diagnosis not present

## 2021-05-20 DIAGNOSIS — Z3A39 39 weeks gestation of pregnancy: Secondary | ICD-10-CM | POA: Diagnosis not present

## 2021-05-20 LAB — COMPREHENSIVE METABOLIC PANEL
ALT: 148 U/L — ABNORMAL HIGH (ref 0–44)
AST: 147 U/L — ABNORMAL HIGH (ref 15–41)
Albumin: 2.6 g/dL — ABNORMAL LOW (ref 3.5–5.0)
Alkaline Phosphatase: 196 U/L — ABNORMAL HIGH (ref 38–126)
Anion gap: 11 (ref 5–15)
BUN: 6 mg/dL (ref 6–20)
CO2: 21 mmol/L — ABNORMAL LOW (ref 22–32)
Calcium: 8.7 mg/dL — ABNORMAL LOW (ref 8.9–10.3)
Chloride: 99 mmol/L (ref 98–111)
Creatinine, Ser: 0.73 mg/dL (ref 0.44–1.00)
GFR, Estimated: >60 mL/min (ref >60)
Glucose, Bld: 72 mg/dL (ref 70–99)
Potassium: 4.5 mmol/L (ref 3.5–5.1)
Sodium: 131 mmol/L — ABNORMAL LOW (ref 135–145)
Total Bilirubin: 1.4 mg/dL — ABNORMAL HIGH (ref 0.3–1.2)
Total Protein: 6.1 g/dL — ABNORMAL LOW (ref 6.5–8.1)

## 2021-05-20 LAB — CBC
HCT: 35.2 % — ABNORMAL LOW (ref 36.0–46.0)
Hemoglobin: 11.3 g/dL — ABNORMAL LOW (ref 12.0–15.0)
MCH: 27.6 pg (ref 26.0–34.0)
MCHC: 32.1 g/dL (ref 30.0–36.0)
MCV: 86.1 fL (ref 80.0–100.0)
Platelets: 246 10*3/uL (ref 150–400)
RBC: 4.09 MIL/uL (ref 3.87–5.11)
RDW: 14.3 % (ref 11.5–15.5)
WBC: 10.3 10*3/uL (ref 4.0–10.5)
nRBC: 0 % (ref 0.0–0.2)

## 2021-05-20 LAB — RPR: RPR Ser Ql: NONREACTIVE

## 2021-05-20 MED ORDER — ACETAMINOPHEN 500 MG PO TABS
1000.0000 mg | ORAL_TABLET | Freq: Four times a day (QID) | ORAL | Status: DC | PRN
  Administered 2021-05-20: 1000 mg via ORAL
  Filled 2021-05-20: qty 2

## 2021-05-20 MED ORDER — ONDANSETRON HCL 4 MG PO TABS: 4.0000 mg | ORAL_TABLET | ORAL | Status: DC | PRN

## 2021-05-20 MED ORDER — COCONUT OIL OIL: 1.0000 "application " | TOPICAL_OIL | Status: DC | PRN

## 2021-05-20 MED ORDER — LABETALOL HCL 5 MG/ML IV SOLN: 40.0000 mg | INTRAVENOUS | Status: DC | PRN

## 2021-05-20 MED ORDER — TRANEXAMIC ACID-NACL 1000-0.7 MG/100ML-% IV SOLN
INTRAVENOUS | Status: AC
  Filled 2021-05-20: qty 100

## 2021-05-20 MED ORDER — ACETAMINOPHEN 325 MG PO TABS: 650.0000 mg | ORAL_TABLET | ORAL | Status: DC | PRN

## 2021-05-20 MED ORDER — WITCH HAZEL-GLYCERIN EX PADS: 1.0000 "application " | MEDICATED_PAD | CUTANEOUS | Status: DC | PRN

## 2021-05-20 MED ORDER — ACETAMINOPHEN 500 MG PO TABS: 1000.0000 mg | ORAL_TABLET | Freq: Four times a day (QID) | ORAL | Status: DC | PRN

## 2021-05-20 MED ORDER — ZOLPIDEM TARTRATE 5 MG PO TABS: 5.0000 mg | ORAL_TABLET | Freq: Every evening | ORAL | Status: DC | PRN

## 2021-05-20 MED ORDER — TETANUS-DIPHTH-ACELL PERTUSSIS 5-2.5-18.5 LF-MCG/0.5 IM SUSY: 0.5000 mL | PREFILLED_SYRINGE | Freq: Once | INTRAMUSCULAR | Status: DC

## 2021-05-20 MED ORDER — LIDOCAINE HCL (PF) 1 % IJ SOLN
INTRAMUSCULAR | Status: DC | PRN
  Administered 2021-05-20: 10 mL via EPIDURAL

## 2021-05-20 MED ORDER — DIPHENHYDRAMINE HCL 25 MG PO CAPS: 25.0000 mg | ORAL_CAPSULE | Freq: Four times a day (QID) | ORAL | Status: DC | PRN

## 2021-05-20 MED ORDER — TERBUTALINE SULFATE 1 MG/ML IJ SOLN: 0.2500 mg | Freq: Once | INTRAMUSCULAR | Status: DC | PRN

## 2021-05-20 MED ORDER — DIBUCAINE (PERIANAL) 1 % EX OINT: 1.0000 "application " | TOPICAL_OINTMENT | CUTANEOUS | Status: DC | PRN

## 2021-05-20 MED ORDER — LACTATED RINGERS IV SOLN: INTRAVENOUS | Status: DC

## 2021-05-20 MED ORDER — OXYTOCIN-SODIUM CHLORIDE 30-0.9 UT/500ML-% IV SOLN: 1.0000 m[IU]/min | INTRAVENOUS | Status: DC

## 2021-05-20 MED ORDER — LABETALOL HCL 5 MG/ML IV SOLN: 80.0000 mg | INTRAVENOUS | Status: DC | PRN

## 2021-05-20 MED ORDER — SENNOSIDES-DOCUSATE SODIUM 8.6-50 MG PO TABS
2.0000 | ORAL_TABLET | ORAL | Status: DC
  Administered 2021-05-20 – 2021-05-21 (×2): 2 via ORAL
  Filled 2021-05-20 (×2): qty 2

## 2021-05-20 MED ORDER — BENZOCAINE-MENTHOL 20-0.5 % EX AERO: 1.0000 "application " | INHALATION_SPRAY | CUTANEOUS | Status: DC | PRN

## 2021-05-20 MED ORDER — MAGNESIUM SULFATE 40 GM/1000ML IV SOLN
INTRAVENOUS | Status: AC
  Filled 2021-05-20: qty 1000

## 2021-05-20 MED ORDER — IBUPROFEN 600 MG PO TABS
600.0000 mg | ORAL_TABLET | Freq: Four times a day (QID) | ORAL | Status: DC
  Administered 2021-05-20 – 2021-05-22 (×8): 600 mg via ORAL
  Filled 2021-05-20 (×8): qty 1

## 2021-05-20 MED ORDER — HYDRALAZINE HCL 20 MG/ML IJ SOLN: 10.0000 mg | INTRAMUSCULAR | Status: DC | PRN

## 2021-05-20 MED ORDER — LABETALOL HCL 5 MG/ML IV SOLN: 20.0000 mg | INTRAVENOUS | Status: DC | PRN

## 2021-05-20 MED ORDER — ONDANSETRON HCL 4 MG/2ML IJ SOLN: 4.0000 mg | INTRAMUSCULAR | Status: DC | PRN

## 2021-05-20 MED ORDER — MAGNESIUM SULFATE 40 GM/1000ML IV SOLN
2.0000 g/h | INTRAVENOUS | Status: AC
Start: 2021-05-20 — End: 2021-05-21
  Filled 2021-05-20: qty 1000

## 2021-05-20 MED ORDER — MAGNESIUM SULFATE BOLUS VIA INFUSION
4.0000 g | Freq: Once | INTRAVENOUS | Status: AC
  Administered 2021-05-20: 4 g via INTRAVENOUS

## 2021-05-20 MED ORDER — PRENATAL MULTIVITAMIN CH
1.0000 | ORAL_TABLET | Freq: Every day | ORAL | Status: DC
  Administered 2021-05-20 – 2021-05-21 (×2): 1 via ORAL
  Filled 2021-05-20 (×2): qty 1

## 2021-05-20 MED ORDER — SIMETHICONE 80 MG PO CHEW: 80.0000 mg | CHEWABLE_TABLET | ORAL | Status: DC | PRN

## 2021-05-20 MED ORDER — TRANEXAMIC ACID-NACL 1000-0.7 MG/100ML-% IV SOLN
1000.0000 mg | INTRAVENOUS | Status: AC
  Administered 2021-05-20: 1000 mg via INTRAVENOUS

## 2021-05-20 NOTE — Anesthesia Postprocedure Evaluation (Signed)
Anesthesia Post Note  Patient: Judy Underwood  Procedure(s) Performed: AN AD HOC LABOR EPIDURAL     Patient location during evaluation: Mother Baby Anesthesia Type: Epidural Level of consciousness: awake and alert Pain management: pain level controlled Vital Signs Assessment: post-procedure vital signs reviewed and stable Respiratory status: spontaneous breathing, nonlabored ventilation and respiratory function stable Cardiovascular status: stable Postop Assessment: no headache, no backache and epidural receding Anesthetic complications: no   No notable events documented.  Last Vitals:  Vitals:   05/20/21 1305 05/20/21 1406  BP: 126/76 123/74  Pulse: 92 97  Resp: 16 16  Temp: 36.9 C   SpO2: 100% 98%    Last Pain:  Vitals:   05/20/21 1305  TempSrc: Oral  PainSc:                  [name]

## 2021-05-20 NOTE — Progress Notes (Signed)
S: Patient resting in bed comfortable with epidural. Patient has no concerns/questions at this time.   O: Vitals:   05/20/21 0333 05/20/21 0401 05/20/21 0431 05/20/21 0501  BP: 111/64 123/71 122/70 124/72  Pulse: 83 65 79 70  Resp:      Temp:      TempSrc:      SpO2:        FHT:  FHR: 135 bpm, variability: moderate,  accelerations:  Present,  decelerations:  Absent UC:   irregular, every 3-4 minutes SVE:   Dilation: 6.5 Effacement (%): 80 Station: -1 Exam by:: Malon Branton, snm  A / P: 21 y.o. G1P0 at [redacted]w[redacted]d here for IOL due to new gHTN progressing normally  -Expectant management- augment prn -new gHTN- patient asymptomatic. Mildly elevated LFTs otherwise normal pre-e labs. Will continue to monitor bp's  -GBS positive- PCN Fetal Wellbeing:  Category I Pain Control:  Epidural Anticipated MOD:  NSVD  Trellis Moment, SNM 05/20/2021, 5:40 AM

## 2021-05-20 NOTE — Discharge Summary (Signed)
Postpartum Discharge Summary  Date of Service updated     Patient Name: Judy Underwood DOB: [birth year] MRN: [id number]  Date of admission: 05/19/2021 Delivery date:05/20/2021  Delivering provider: Olga Coaster  Date of discharge: 05/22/2021  Admitting diagnosis: Elevated blood pressure affecting pregnancy in third trimester, antepartum [O16.3] Intrauterine pregnancy: [redacted]w[redacted]d    Secondary diagnosis:  Principal Problem:   Spontaneous vaginal delivery Active Problems:   Elevated blood pressure affecting pregnancy in third trimester, antepartum   Periurethral laceration, delivered, current hospitalization   COVID-19   Anemia due to acute blood loss  Additional problems: Severe Preeclampsia, elevated LFTs    Discharge diagnosis: Term Pregnancy Delivered, Preeclampsia (severe), and Elevated LFTs, COVID positive                                               Post partum procedures: None Augmentation: Pitocin Complications: None  Hospital course: Onset of Labor With Vaginal Delivery      21y.o. yo G1P1001 at 330w6das admitted in Active Labor on 05/19/2021. Patient had an uncomplicated labor course as follows:  Membrane Rupture Time/Date:  ,   Delivery Method:Vaginal, Spontaneous  Episiotomy: None  Lacerations:  Periurethral  Patient had an uncomplicated postpartum course.  She is ambulating, tolerating a regular diet, passing flatus, and urinating well. Patient is discharged home in stable condition on 05/22/21.  Newborn Data: Birth date:05/20/2021  Birth time:9:38 AM  Gender:Female  Living status:Living  Apgars:9 ,9  Weight:3445 g   Magnesium Sulfate received: Yes: Seizure prophylaxis BMZ received: No Rhophylac:N/A MMR:N/A T-DaP:Given prenatally Flu: No Transfusion:No  Physical exam  Vitals:   05/21/21 1533 05/21/21 1940 05/22/21 0022 05/22/21 0442  BP: 119/76 110/76 112/65 (!) 142/80  Pulse: 73 79 61 (!) 59  Resp: 20 18 18 18   Temp: (!) 97.5 F (36.4 C) 98.1 F  (36.7 C) 97.7 F (36.5 C) 97.6 F (36.4 C)  TempSrc: Oral Oral Oral Oral  SpO2: 100% 100% 100% 100%   General: alert, cooperative, and no distress Lochia: appropriate Uterine Fundus: firm Incision: N/A DVT Evaluation: No evidence of DVT seen on physical exam. Labs: Lab Results  Component Value Date   WBC 7.5 05/21/2021   HGB 10.3 (L) 05/21/2021   HCT 32.1 (L) 05/21/2021   MCV 84.9 05/21/2021   PLT 233 05/21/2021   CMP Latest Ref Rng & Units 05/21/2021  Glucose 70 - 99 mg/dL 86  BUN 6 - 20 mg/dL <5(L)  Creatinine 0.44 - 1.00 mg/dL 0.59  Sodium 135 - 145 mmol/L 133(L)  Potassium 3.5 - 5.1 mmol/L 4.0  Chloride 98 - 111 mmol/L 103  CO2 22 - 32 mmol/L 23  Calcium 8.9 - 10.3 mg/dL 6.8(L)  Total Protein 6.5 - 8.1 g/dL 5.5(L)  Total Bilirubin 0.3 - 1.2 mg/dL 0.7  Alkaline Phos 38 - 126 U/L 179(H)  AST 15 - 41 U/L 69(H)  ALT 0 - 44 U/L 108(H)   EdFlavia Shippercore: No flowsheet data found.   After visit meds:  Allergies as of 05/22/2021   No Known Allergies      Medication List     TAKE these medications    acetaminophen 325 MG tablet Commonly known as: Tylenol Take 2 tablets (650 mg total) by mouth every 4 (four) hours as needed (for pain scale < 4).   ferrous gluconate 324 MG tablet  Commonly known as: FERGON Take 1 tablet (324 mg total) by mouth every other day. Start taking on: May 23, 2021   furosemide 20 MG tablet Commonly known as: LASIX Take 1 tablet (20 mg total) by mouth 2 (two) times daily for 3 days.   ibuprofen 600 MG tablet Commonly known as: ADVIL Take 1 tablet (600 mg total) by mouth every 6 (six) hours.   NIFEdipine 30 MG 24 hr tablet Commonly known as: PROCARDIA-XL/NIFEDICAL-XL Take 1 tablet (30 mg total) by mouth daily. Can increase to twice a day as needed for symptomatic contractions   prenatal multivitamin Tabs tablet Take 1 tablet by mouth daily at 12 noon. What changed:  medication strength when to take this          Discharge home in stable condition Infant Feeding: Bottle Infant Disposition:home with mother Discharge instruction: per After Visit Summary and Postpartum booklet. Activity: Advance as tolerated. Pelvic rest for 6 weeks.  Diet: routine diet Future Appointments: Future Appointments  Date Time Provider Kappa  07/01/2021  2:15 PM Constant, Vickii Chafe, MD [facility]   Follow up Visit:  [facility] for [facility] at [facility] Follow up in 1 week(s).   Specialty: Obstetrics and Gynecology Contact information: [place] [phone]              Message Sent 2/22   Please schedule this patient for a In person postpartum visit in 6 weeks with the following provider: Any provider. Message sent to be scheduled for 1 week BP check in the office.  Additional Postpartum F/U: None   Low risk pregnancy complicated by:  None Delivery mode:  Vaginal, Spontaneous  Anticipated Birth Control:   Patch   05/22/2021 Radene Gunning, MD

## 2021-05-20 NOTE — Anesthesia Preprocedure Evaluation (Signed)
Anesthesia Evaluation  Patient identified by MRN, date of birth, ID band Patient awake    Reviewed: Allergy & Precautions, H&P , Patient's Chart, lab work & pertinent test results  Airway Mallampati: I       Dental no notable dental hx.    Pulmonary neg pulmonary ROS,    Pulmonary exam normal        Cardiovascular negative cardio ROS Normal cardiovascular exam     Neuro/Psych negative neurological ROS  negative psych ROS   GI/Hepatic negative GI ROS, Neg liver ROS,   Endo/Other  negative endocrine ROS  Renal/GU negative Renal ROS  negative genitourinary   Musculoskeletal negative musculoskeletal ROS (+)   Abdominal (+) + obese,   Peds  Hematology negative hematology ROS (+)   Anesthesia Other Findings   Reproductive/Obstetrics (+) Pregnancy                             Anesthesia Physical Anesthesia Plan  ASA: 2  Anesthesia Plan: Epidural   Post-op Pain Management:    Induction:   PONV Risk Score and Plan:   Airway Management Planned:   Additional Equipment:   Intra-op Plan:   Post-operative Plan:   Informed Consent: I have reviewed the patients History and Physical, chart, labs and discussed the procedure including the risks, benefits and alternatives for the proposed anesthesia with the patient or authorized representative who has indicated his/her understanding and acceptance.       Plan Discussed with:   Anesthesia Plan Comments:         Anesthesia Quick Evaluation  

## 2021-05-20 NOTE — Progress Notes (Signed)
S: Patient resting in bed now comfortable with her epidural.   O: Vitals:   05/20/21 0105 05/20/21 0115 05/20/21 0116 05/20/21 0121  BP: 114/67 (!) 118/53 123/64 119/69  Pulse: 74 75 71 78  Resp:      Temp: 99.3 F (37.4 C)     TempSrc: Axillary     SpO2:        FHT:  FHR: 140 bpm, variability: moderate,  accelerations:  Present,  decelerations:  Absent UC:   regular, every 1.5-3 minutes SVE:   Dilation: 5 Effacement (%): 90 Station: -2 Exam by:: Mellody Dance, RN  A / P: 21 y.o. G1P0 at [redacted]w[redacted]d here for IOL due to new gHTN, asymptomatic.   -GBS positive- PCN -gHTN- mildly elevated LFTs otherwise normal pre-e labs. Will continue to monitor BP -Expectant management. Discussed Pitocin vs. AROM for labor augmentation if ctxs space out or if not cervical change at next exam Fetal Wellbeing:  Category I Pain Control:  Epidural Anticipated MOD:  NSVD  Derryl Harbor 05/20/2021, 1:36 AM

## 2021-05-20 NOTE — Anesthesia Procedure Notes (Signed)
Epidural Patient location during procedure: OB Start time: 05/20/2021 12:39 AM End time: 05/20/2021 12:43 AM  Staffing Anesthesiologist: Leilani Able, MD Performed: anesthesiologist   Preanesthetic Checklist Completed: patient identified, IV checked, site marked, risks and benefits discussed, surgical consent, monitors and equipment checked, pre-op evaluation and timeout performed  Epidural Patient position: sitting Prep: DuraPrep and site prepped and draped Patient monitoring: continuous pulse ox and blood pressure Approach: midline Location: L3-L4 Injection technique: LOR air  Needle:  Needle type: Tuohy  Needle gauge: 17 G Needle length: 9 cm and 9 Needle insertion depth: 6 cm Catheter type: closed end flexible Catheter size: 19 Gauge Catheter at skin depth: 11 cm Test dose: negative and Other  Assessment Events: blood not aspirated, injection not painful, no injection resistance, no paresthesia and negative IV test  Additional Notes Reason for block:procedure for pain

## 2021-05-20 NOTE — Lactation Note (Signed)
This note was copied from a baby's chart. Lactation Consultation Note  Patient Name: Judy Underwood [id number] Date: 05/20/2021 Reason for consult: L&D Initial assessment;Primapara;Term Age:21 hours  Mom is a P1 who reports + breast changes w/pregnancy.  Mom's intent is to provide colostrum only & then she will switch to bottle feeding formula. I encouraged Mom to breastfeed for 3 days to ensure that infant gets her colostrum.   Mom is able to evert her nipples with stimulation. Infant latched with ease, suckled for a short period of time and then fell asleep. I educated Mom about initial, sleepy newborn behavior.   LC to f/u. Mom could benefit from education on benefits of breast milk versus formula.     Lurline Hare [facility] 05/20/2021, 11:14 AM

## 2021-05-20 NOTE — Lactation Note (Signed)
This note was copied from a baby's chart. Lactation Consultation Note  Patient Name: Judy Underwood [id number] Date: 05/20/2021 Reason for consult: Initial assessment;Primapara;1st time breastfeeding;Term Age:21 hours  LC in to visit with P1 Mom of term baby.  Mom stated she did not want to breastfeed her baby.  Lactation services are complete.  Mom to let her RN know to call LC if she changes her mind.    Consult Status Consult Status: Complete Date: 05/20/21 Follow-up type: Call as needed    Judy Underwood 05/20/2021, 5:08 PM

## 2021-05-21 ENCOUNTER — Inpatient Hospital Stay (HOSPITAL_COMMUNITY): Admit: 2021-05-21 | Payer: Self-pay

## 2021-05-21 DIAGNOSIS — D62 Acute posthemorrhagic anemia: Secondary | ICD-10-CM

## 2021-05-21 LAB — COMPREHENSIVE METABOLIC PANEL
ALT: 108 U/L — ABNORMAL HIGH (ref 0–44)
AST: 69 U/L — ABNORMAL HIGH (ref 15–41)
Albumin: 2.3 g/dL — ABNORMAL LOW (ref 3.5–5.0)
Alkaline Phosphatase: 179 U/L — ABNORMAL HIGH (ref 38–126)
Anion gap: 7 (ref 5–15)
BUN: <5 mg/dL — ABNORMAL LOW (ref 6–20)
CO2: 23 mmol/L (ref 22–32)
Calcium: 6.8 mg/dL — ABNORMAL LOW (ref 8.9–10.3)
Chloride: 103 mmol/L (ref 98–111)
Creatinine, Ser: 0.59 mg/dL (ref 0.44–1.00)
GFR, Estimated: >60 mL/min (ref >60)
Glucose, Bld: 86 mg/dL (ref 70–99)
Potassium: 4 mmol/L (ref 3.5–5.1)
Sodium: 133 mmol/L — ABNORMAL LOW (ref 135–145)
Total Bilirubin: 0.7 mg/dL (ref 0.3–1.2)
Total Protein: 5.5 g/dL — ABNORMAL LOW (ref 6.5–8.1)

## 2021-05-21 LAB — CBC
HCT: 32.1 % — ABNORMAL LOW (ref 36.0–46.0)
Hemoglobin: 10.3 g/dL — ABNORMAL LOW (ref 12.0–15.0)
MCH: 27.2 pg (ref 26.0–34.0)
MCHC: 32.1 g/dL (ref 30.0–36.0)
MCV: 84.9 fL (ref 80.0–100.0)
Platelets: 233 10*3/uL (ref 150–400)
RBC: 3.78 MIL/uL — ABNORMAL LOW (ref 3.87–5.11)
RDW: 14.7 % (ref 11.5–15.5)
WBC: 7.5 10*3/uL (ref 4.0–10.5)
nRBC: 0 % (ref 0.0–0.2)

## 2021-05-21 MED ORDER — FUROSEMIDE 40 MG PO TABS
20.0000 mg | ORAL_TABLET | Freq: Two times a day (BID) | ORAL | Status: DC
Start: 2021-05-21 — End: 2021-05-22
  Administered 2021-05-21 – 2021-05-22 (×3): 20 mg via ORAL
  Filled 2021-05-21 (×3): qty 1

## 2021-05-21 MED ORDER — FERROUS GLUCONATE 324 (38 FE) MG PO TABS
324.0000 mg | ORAL_TABLET | ORAL | Status: DC
  Administered 2021-05-21: 324 mg via ORAL
  Filled 2021-05-21: qty 1

## 2021-05-21 MED ORDER — NIFEDIPINE ER OSMOTIC RELEASE 30 MG PO TB24: 30.0000 mg | ORAL_TABLET | Freq: Every day | ORAL | Status: DC

## 2021-05-21 NOTE — Progress Notes (Signed)
Post Partum Day 1 Subjective: No complaints, up ad lib, voiding, tolerating PO, and + flatus. Baby is stable at bedside. Patient denies any headaches, visual symptoms, RUQ/epigastric pain or other concerning symptoms.  Objective: Blood pressure 119/70, pulse 70, temperature 97.6 F (36.4 C), temperature source Oral, resp. rate 17, last menstrual period 08/14/2020, SpO2 100 %, unknown if currently breastfeeding. Vitals:   05/21/21 0452 05/21/21 0458 05/21/21 0630 05/21/21 0747  BP: 129/79   119/70  Pulse: 79   70  Resp: 17 17 17 17   Temp: (!) 97.1 F (36.2 C)   97.6 F (36.4 C)  TempSrc: Oral   Oral  SpO2: 100%   100%    Physical Exam:  General: alert and no distress Lochia: appropriate Uterine Fundus: firm DVT Evaluation: No evidence of DVT seen on physical exam. Negative Homan's sign. No cords or calf tenderness.  Recent Labs    05/20/21 0809 05/21/21 0755  HGB 11.3* 10.3*  HCT 35.2* 32.1*    Assessment/Plan: Plan for discharge tomorrow and Contraception Pills Oral iron therapy for mild postpartum anemia due to blood loss during delivery Lasix 20 mg bid x 5 days. BP stable, continue to monitor Routine postpartum care    LOS: 2 days   05/23/21, MD 05/21/2021, 9:39 AM

## 2021-05-21 NOTE — Progress Notes (Signed)
Pt set up with DEBP.

## 2021-05-21 NOTE — Social Work (Signed)
CSW consulted for "Pt currently lives in hotel with boyfriend."  ° °Due to isolation precautions, CSW contacted MOB by phone to assess and provide support. CSW introduced self and role. CSW assessed current mood. MOB was pleasant as she reported she is doing well. MOB shared she also had a good pregnancy. CSW asked MOB if she has any needs at this time. MOB reported she is in need for a car seat. CSW informed MOB that the hospital is currently out of car seats and asked if MOB has another way of obtaining one. MOB stated she knows someone who can assist with a car seat for discharge, as well as for infants appointments. MOB stated she has all other needs for infant, including a crib. MOB stated all of her essential needs are being met at this time. MOB receives food stamp benefits and expressed interest in WIC. CSW provided MOB within information on WIC and ensured MOB feels comfortable making an appointment. MOB declined additional resources, however requested CSW provide her with the information to discuss with FOB. ° °CSW reviewed baby blues versus perinatal mood disorders with MOB, as well as provided education on Sudden Infant Death Syndrome (SIDS). MOB stated she is doing well emotionally and denies any current SI or HI.  ° °CSW identifies no further need for intervention and no barriers to discharge at this time. ° °Chaney Johnson, LCSWA °Clinical Social Work °[facility] °[phone] ° °

## 2021-05-22 ENCOUNTER — Other Ambulatory Visit (HOSPITAL_COMMUNITY): Payer: Self-pay

## 2021-05-22 MED ORDER — FUROSEMIDE 20 MG PO TABS
20.0000 mg | ORAL_TABLET | Freq: Two times a day (BID) | ORAL | 0 refills | Status: DC
Start: 2021-05-22 — End: 2022-04-05
  Filled 2021-05-22: qty 6, 3d supply, fill #0

## 2021-05-22 MED ORDER — NIFEDIPINE ER OSMOTIC RELEASE 30 MG PO TB24
30.0000 mg | ORAL_TABLET | Freq: Every day | ORAL | Status: DC
Start: 2021-05-22 — End: 2021-05-22
  Administered 2021-05-22: 30 mg via ORAL
  Filled 2021-05-22: qty 1

## 2021-05-22 MED ORDER — ACETAMINOPHEN 325 MG PO TABS
650.0000 mg | ORAL_TABLET | ORAL | 1 refills | Status: DC | PRN
  Filled 2021-05-22: qty 60, 5d supply, fill #0
  Filled 2021-07-28: qty 60, 5d supply, fill #1

## 2021-05-22 MED ORDER — NIFEDIPINE ER 30 MG PO TB24
30.0000 mg | ORAL_TABLET | Freq: Every day | ORAL | 2 refills | Status: DC
  Filled 2021-05-22: qty 30, 15d supply, fill #0
  Filled 2021-07-28: qty 30, 15d supply, fill #1

## 2021-05-22 MED ORDER — PRENATAL 27-1 MG PO TABS
1.0000 | ORAL_TABLET | Freq: Every day | ORAL | 1 refills | Status: DC
  Filled 2021-05-22: qty 30, 30d supply, fill #0

## 2021-05-22 MED ORDER — IBUPROFEN 600 MG PO TABS
600.0000 mg | ORAL_TABLET | Freq: Four times a day (QID) | ORAL | 0 refills | Status: DC
  Filled 2021-05-22: qty 30, 8d supply, fill #0

## 2021-05-22 MED ORDER — FERROUS GLUCONATE 324 (38 FE) MG PO TABS
324.0000 mg | ORAL_TABLET | ORAL | 1 refills | Status: DC
  Filled 2021-05-22: qty 15, 30d supply, fill #0
  Filled 2021-07-28: qty 30, 60d supply, fill #1

## 2021-05-22 NOTE — Plan of Care (Signed)
Problem: Education: °Goal: Knowledge of General Education information will improve °Description: Including pain rating scale, medication(s)/side effects and non-pharmacologic comfort measures °Outcome: Completed/Met °  °Problem: Health Behavior/Discharge Planning: °Goal: Ability to manage health-related needs will improve °Outcome: Completed/Met °  °Problem: Clinical Measurements: °Goal: Ability to maintain clinical measurements within normal limits will improve °Outcome: Completed/Met °Goal: Will remain free from infection °Outcome: Completed/Met °Goal: Diagnostic test results will improve °Outcome: Completed/Met °Goal: Respiratory complications will improve °Outcome: Completed/Met °Goal: Cardiovascular complication will be avoided °Outcome: Completed/Met °  °Problem: Activity: °Goal: Risk for activity intolerance will decrease °Outcome: Completed/Met °  °Problem: Nutrition: °Goal: Adequate nutrition will be maintained °Outcome: Completed/Met °  °Problem: Coping: °Goal: Level of anxiety will decrease °Outcome: Completed/Met °  °Problem: Elimination: °Goal: Will not experience complications related to bowel motility °Outcome: Completed/Met °Goal: Will not experience complications related to urinary retention °Outcome: Completed/Met °  °Problem: Pain Managment: °Goal: General experience of comfort will improve °Outcome: Completed/Met °  °Problem: Safety: °Goal: Ability to remain free from injury will improve °Outcome: Completed/Met °  °Problem: Skin Integrity: °Goal: Risk for impaired skin integrity will decrease °Outcome: Completed/Met °  °Problem: Education: °Goal: Knowledge of disease or condition will improve °Outcome: Completed/Met °Goal: Knowledge of the prescribed therapeutic regimen will improve °Outcome: Completed/Met °  °Problem: Fluid Volume: °Goal: Peripheral tissue perfusion will improve °Outcome: Completed/Met °  °Problem: Clinical Measurements: °Goal: Complications related to disease process,  condition or treatment will be avoided or minimized °Outcome: Completed/Met °  °Problem: Education: °Goal: Knowledge of condition will improve °Outcome: Completed/Met °Goal: Individualized Educational Video(s) °Outcome: Completed/Met °Goal: Individualized Newborn Educational Video(s) °Outcome: Completed/Met °  °Problem: Activity: °Goal: Will verbalize the importance of balancing activity with adequate rest periods °Outcome: Completed/Met °Goal: Ability to tolerate increased activity will improve °Outcome: Completed/Met °  °Problem: Coping: °Goal: Ability to identify and utilize available resources and services will improve °Outcome: Completed/Met °  °Problem: Life Cycle: °Goal: Chance of risk for complications during the postpartum period will decrease °Outcome: Completed/Met °  °Problem: Role Relationship: °Goal: Ability to demonstrate positive interaction with newborn will improve °Outcome: Completed/Met °  °Problem: Skin Integrity: °Goal: Demonstration of wound healing without infection will improve °Outcome: Completed/Met °  °

## 2021-05-25 ENCOUNTER — Telehealth: Payer: Self-pay

## 2021-05-25 NOTE — Telephone Encounter (Signed)
Transition Care Management Unsuccessful Follow-up Telephone Call  Date of discharge and from where:  05/22/2021 from [facility]  Attempts:  1st Attempt  Reason for unsuccessful TCM follow-up call:  Left voice message

## 2021-05-26 NOTE — Telephone Encounter (Signed)
Transition Care Management Unsuccessful Follow-up Telephone Call  Date of discharge and from where:  05/22/2021 from [facility]  Attempts:  2nd Attempt  Reason for unsuccessful TCM follow-up call:  Left voice message

## 2021-05-27 NOTE — Telephone Encounter (Signed)
Transition Care Management Unsuccessful Follow-up Telephone Call ? ?Date of discharge and from where:  05/22/2021 from [facility] ? ?Attempts:  3rd Attempt ? ?Reason for unsuccessful TCM follow-up call:  Unable to reach patient ? ? ? ?

## 2021-05-28 ENCOUNTER — Ambulatory Visit: Payer: Medicaid Other

## 2021-05-30 ENCOUNTER — Telehealth (HOSPITAL_COMMUNITY): Payer: Self-pay

## 2021-05-30 NOTE — Telephone Encounter (Signed)
No answer. Left message to return nurse call. ? Heron Nay ?05/30/2021,1323 ?

## 2021-06-30 ENCOUNTER — Telehealth: Payer: Self-pay | Admitting: Family Medicine

## 2021-06-30 NOTE — Telephone Encounter (Signed)
Called patient to confirm appt for 07/01/21 @ 2:15pm ?

## 2021-07-01 ENCOUNTER — Ambulatory Visit: Payer: Medicaid Other | Admitting: Obstetrics and Gynecology

## 2021-07-21 ENCOUNTER — Ambulatory Visit: Payer: Medicaid Other | Admitting: Student

## 2021-07-28 ENCOUNTER — Other Ambulatory Visit: Payer: Self-pay | Admitting: Obstetrics and Gynecology

## 2021-07-28 ENCOUNTER — Other Ambulatory Visit (HOSPITAL_COMMUNITY): Payer: Self-pay

## 2021-07-29 ENCOUNTER — Other Ambulatory Visit (HOSPITAL_COMMUNITY): Payer: Self-pay

## 2021-07-30 ENCOUNTER — Other Ambulatory Visit (HOSPITAL_COMMUNITY): Payer: Self-pay

## 2021-08-04 ENCOUNTER — Other Ambulatory Visit (HOSPITAL_COMMUNITY): Payer: Self-pay

## 2021-08-07 ENCOUNTER — Other Ambulatory Visit (HOSPITAL_COMMUNITY): Payer: Self-pay

## 2021-08-08 ENCOUNTER — Other Ambulatory Visit: Payer: Self-pay | Admitting: Obstetrics and Gynecology

## 2021-08-08 ENCOUNTER — Other Ambulatory Visit (HOSPITAL_COMMUNITY): Payer: Self-pay

## 2021-08-10 ENCOUNTER — Other Ambulatory Visit (HOSPITAL_COMMUNITY): Payer: Self-pay

## 2021-08-13 ENCOUNTER — Telehealth: Payer: Self-pay

## 2021-08-13 NOTE — Progress Notes (Deleted)
Patient left MyChart video visit prior to provider signing in. Attempted to contact patient about logging back in for visit. Patient did not answer, voicemail was left for patient to contact the office.  

## 2021-08-13 NOTE — Progress Notes (Unsigned)
I connected with  Judy Underwood on 08/13/21 at  3:55 PM EDT by MyChart Virtual Video Visit and verified that I am speaking with the correct person using two identifiers.   I discussed the limitations, risks, security and privacy concerns of performing an evaluation and management service by telephone and the availability of in person appointments. I also discussed with the patient that there may be a patient responsible charge related to this service. The patient expressed understanding and agreed to proceed.  Guy Begin, CMA 08/13/2021  4:06 PM

## 2021-08-13 NOTE — Telephone Encounter (Signed)
Patient left MyChart video visit prior to provider signing in. Attempted to contact patient about logging back in for visit. Patient did not answer, voicemail was left for patient to contact the office.

## 2021-08-13 NOTE — Progress Notes (Unsigned)
Patient complains of intermittent chest pains. She stated "I don't have chest pains everyday, only when I eat certain stuff"

## 2021-08-17 ENCOUNTER — Other Ambulatory Visit (HOSPITAL_COMMUNITY): Payer: Self-pay

## 2021-08-26 ENCOUNTER — Other Ambulatory Visit (HOSPITAL_COMMUNITY): Payer: Self-pay

## 2022-03-29 NOTE — L&D Delivery Note (Addendum)
OB/GYN Faculty Practice Delivery Note  Judy Underwood is a 22 y.o. G2P1001 s/p SVD at [redacted]w[redacted]d. She was admitted for early labor and PPROM.   ROM: 23h 36m with clear fluid GBS Status: Unknown   Maximum Maternal Temperature: 98.8  Labor Progress: Initial SVE: 2.5/60/-2. She then progressed to complete.   Delivery Date/Time: 11/03/22 - 1706 Delivery: Called to room and patient was complete and pushing. Head delivered OA. No nuchal cord present. Shoulder and body delivered in usual fashion. Infant with spontaneous cry, placed on mother's abdomen, dried and stimulated. Cord clamped x 2 after 1-minute delay, and cut by mother. Cord blood drawn. Placenta delivered spontaneously with gentle cord traction. Fundus firm with massage and Pitocin. Labia, perineum, vagina, and cervix inspected with no tears.  Baby Weight: pending  Placenta: 3 vessel, intact. Sent to L&D Complications: None Lacerations: None EBL: 512 mL Analgesia: Epidural   Infant:  APGAR (1 MIN):   APGAR (5 MINS):    Olga Millers, DO PGY-3 Family Medicine 11/03/2022, 5:18 PM   Fellow Attestation: I was present for the entire procedure and directly supervised the resident doctor. I agree with procedure note above. Procedure was uncomplicated and well tolerated.  Sheppard Evens MD MPH OB Fellow, Faculty Practice [facility], [facility] 11/03/2022

## 2022-04-02 ENCOUNTER — Other Ambulatory Visit (HOSPITAL_COMMUNITY): Payer: Self-pay

## 2022-04-05 ENCOUNTER — Ambulatory Visit (INDEPENDENT_AMBULATORY_CARE_PROVIDER_SITE_OTHER): Payer: Commercial Managed Care - HMO

## 2022-04-05 DIAGNOSIS — Z3201 Encounter for pregnancy test, result positive: Secondary | ICD-10-CM | POA: Diagnosis not present

## 2022-04-05 LAB — POCT PREGNANCY, URINE: Preg Test, Ur: POSITIVE — AB

## 2022-04-05 NOTE — Progress Notes (Signed)
Pt left urine for walk in UPT resulting positive.  Called pt and left message that I am calling with results.  Please return call to the office.    Pt returned call.  Pt reports LMP 03/02/22 which makes her EDD 12/07/22 and 4w 6d today.  Pt denies VB and pain.  Medications and allergies reviewed.  Pt advised to start taking PNV.  Pt informed when to go to [facility].  Pt desires to start prenatal care at [facility] as she delivered her last baby with our practice.  [place] office notified and pt informed that front office will contact her with NEW OB intake and provider appt.   Pt verbalized understanding to all that was discussed and had no further questions.    Judy Underwood  04/05/22

## 2022-04-23 ENCOUNTER — Telehealth: Payer: Self-pay | Admitting: Family Medicine

## 2022-04-23 DIAGNOSIS — O219 Vomiting of pregnancy, unspecified: Secondary | ICD-10-CM

## 2022-04-23 MED ORDER — PROMETHAZINE HCL 25 MG PO TABS: 25.0000 mg | ORAL_TABLET | Freq: Four times a day (QID) | ORAL | 0 refills | Status: DC | PRN

## 2022-04-23 NOTE — Telephone Encounter (Signed)
Returned phone call from patient regarding stomach pain and nausea. Pt denies cramping and vaginal bleeding. States her stomach pain feels like hunger because she is not able to keep any food down. Rx sent to pharmacy per protocol. Pt verbalized understanding and denied further questions. Patient next appointment 02/06. Pt aware.

## 2022-04-23 NOTE — Telephone Encounter (Signed)
Patient called in stating every morning when she wakes up her stomach is hurting her and when she eats it hurts. She also stated she has throw up a few times. She would like to speak with a nurse about this.

## 2022-05-04 ENCOUNTER — Telehealth (INDEPENDENT_AMBULATORY_CARE_PROVIDER_SITE_OTHER): Payer: Medicaid Other

## 2022-05-04 DIAGNOSIS — Z3687 Encounter for antenatal screening for uncertain dates: Secondary | ICD-10-CM

## 2022-05-04 DIAGNOSIS — Z3480 Encounter for supervision of other normal pregnancy, unspecified trimester: Secondary | ICD-10-CM

## 2022-05-04 DIAGNOSIS — Z3490 Encounter for supervision of normal pregnancy, unspecified, unspecified trimester: Secondary | ICD-10-CM | POA: Insufficient documentation

## 2022-05-04 MED ORDER — BLOOD PRESSURE MONITORING DEVI: 1.0000 | 0 refills | Status: DC

## 2022-05-04 NOTE — Patient Instructions (Signed)
[place] [facility] Providers  Central/[place]outheast [place] ([address]) [facility] Brown, MD; Chambliss, MD; Eniola, MD; Hensel, MD; McDiarmid, MD; McIntyer, MD [address] [place], [place] [address] [phone] Mon-Fri 8:30-12:30, 1:30-5:00  Providers come to see babies during newborn hospitalization Only accepting infants of Mother's who are seen at Family Medicine Center or have siblings seen at   [facility] Center Medicaid - Yes; Tricare - Yes   [facility] Mulberry, MD [address] [place], [place] [address] [phone] Mon, Tue, Thur, Fri 8:30-5:00, Wed 10:00-7:00 (closed 1-2pm daily for lunch) Takes [place] residents with no insurance.  [place] only with Medicaid/insurance; Tricare - no  [facility] Ben-Davies, MD; Brown, MD; Chandler, MD; Ettefagh, MD; Grant, MD; Hanvey, MD; Herrin, MD; Jones,  MD; Lester, MD; McCormick, MD; McQueen, MD; Simha, MD; Stanley, MD; Stryffeler, NP [address], [place], [place] [address] [phone] Mon, Tue, Thur, Fri 8:30-5:30, Wed 9:30-5:30, Sat 8:30-12:30 Only accepting infants of first-time parents or siblings of current patients Hospital discharge coordinator will make follow-up appointment Medicaid - yes; Tricare - yes  [place] ([address]) [facility] Cox, MD; Davis, MD; Dovico, MD; Ettefaugh, MD; Lowe, MD; Nation, MD; Slimp, MD; Sumner, MD; Williams, MD [address], [place], [place] [address] [phone] Mon-Fri 8:30-5:00, closed for lunch 12:30-1:30; Sat-Sun 10:00-1:00 Accepting Newborns with commercial insurance only, must call prior to delivery to be accepted into  practice.  Medicaid - no, Tricare - yes   [facility] [address] [place], [place] [address] [phone] or [phone] Mon to Fri 8am to 10pm, Sat 8am to 1pm  (virtual only on weekends) Only accepts Medicaid Healthy Blue pts  [facility]  Artis, MD; Coccaro, MD; [place] Gardner, MD; Netherton, NP; Roper, MD; Wilmot, PA-C; Skinner, MD [address] [place], [place] [address] [phone] Mon-Fri 8:30-5:30 Medicaid - yes, Tricare - yes  [place] ([address]) [facility] Warner, MD [address], [place], [place] [address] [phone] Mon, Tues, Wed Fri 8:30-5:00, Sat 8:30-12:00, Closed Thursdays Accepting siblings of established patients and first time mom's if you call prenatally Medicaid- yes; Tricare - yes  [facility] Becker, PA; Hagler, MD; Quinn, PA-C; Scifres, PA; Sun, MD; Swayne, MD;  [address], [place], [place] [address] [phone] Mon-Fri 8:30-5:00, closed for lunch 1-2 Only accepting newborns of established patients Medicaid- no; Tricare - yes  [place] ([address]) [facility] Timberlake, MD; [address], [place], [place] [address] [phone] Mon-Fri 8:00-5:00 Medicaid - No; Tricare - Yes  [facility] [employer]  Brake, NP; Wharton, PA [address], [place], [place] [address] [phone] Mon-Fri 8:00-5:00 Medicaid - No, Tricare - Yes  [facility] Gay, MD; Quinlan, MD; Blatt, DNP [address] [place], [place] [address] [phone]  Mon-Fri 8:00-5:00 Medicaid - No; Tricare - Yes  [facility] [address] [place], [place] [address] [phone] Mon-Fri 8:30-5:00 (lunch 12:00-1:00) Medicaid -Yes; Tricare - Yes  [facility] Jordan, MD [address], [place], [place] [address] [phone] Mon-Fri 8:00-5:00 Seeing newborns of current patients only. No new patients Medicaid - No, Tricare - yes  [facility] Parker, MD [address] [place], [place] [address] [phone] Mon-Fri 8:00-5:00 Medicaid -yes as secondary coverage only;   Tricare - yes  [facility] Brecken, PA; Christy, NP; Dees, MD; DeClaire, MD; DeWeese, MD; Hodge, PA; Smoot, NP; Summer, MD; Vapne, MD [address] [place], [place] [address] [phone] Mon-Fri 8:30-5:00, Sat 9:00-11:00 Accepts commercial insurance ONLY. Offers free prenatal information sessions for families. Medicaid - No, Tricare - Call first  [facility] Bouska, MD; Gordon, PA; Jeffery, PA; Weber, PA [address] [place] [address] [phone] Mon-Fri 7:30-5:30 Medicaid - Yes; Tricare - yes  [place] ([address] & [address])  [facility] Reese, MD [address] [place], [place] [address] [phone] Mon-Thur 8:00-6:00, closed for lunch 12-2, closed Fridays Medicaid - yes; Tricare - no  [facility] Anderson, NP; Badger, MD; Beal, PA; Spencer, PA [address], [place], [place] [address] [phone] Mon-Fri 7:30-4:30 Medicaid - yes, Tricare - yes  [facility]  Agbuya, MD; Klett, NP; Romgoolam, MD; Rothstein, NP [address] [place] [address], [place], [place] [address] [phone] Mon-Fri 8:30-5:00, closed for lunch 1-2, Sat 8:30-12:00 - sick visits only Providers come to see babies at [facility] Only accepting newborns of siblings and first time parents ONLY if who have met with office prior to delivery Medicaid -Yes; Tricare - yes  [facility] [place]  Golden, DO; Friddle, NP; Wallace, MD; Wood, MD:  [address] [place] [address], [place], [place] [address] [phone] Mon- Fri 8:00-5:00, Sat 9:00-12:00 - sick visits only Accepting siblings of established patients and first time mom/baby Medicaid - Yes; Tricare - yes Patients must have vaccinations (baby vaccines)  [place] ([address] &  [address])  [facility] [address] [place], [place] [address] [phone] Mon-Fri 8:00-5:00 Medicaid - no; Tricare - yes  [facility] Briscoe, MD; Schmidt, PA; Moreira, PA [address], [place], [place] [address] [phone] Mon-Fri 8:00-5:00 Medicaid- yes; Tricare - yes  [facility] Boyd, MD; Jones, NP; Osborn, PA [address], [place], [place] [address] [phone] Mon-Fri 8:00-5:00 Medicaid - Yes; Tricare - yes  [place] ([address])  [facility] Atkinson, PA; Calderon, PA; Cummings, MD; Dillard, MD; Henrish, NP; Isenhour, DO; Martin, PA; Olson, MD; Ott, MD; Phillips, MD; Valente, PA; VanDeven, PA; Yonjof, NP [address], [place], [place] [address] [phone] Mon-Fri 8:30-5:00, Sat 9:00-12:00 - sick only Please register online [email or web address] then schedule online or call office Medicaid-Yes; Tricare -yes  [facility]  Dabrusco, MD; Dial, MD; Fountain, MD; Fleenor, NP; Goolsby, PA; Tonuzi, MD; Turner, NP; West, MD [address], [place], [place] [address] [phone] Mon-Fri 8:00-5:30, Sat&Sun by appointment (phones open at 8:30) Medicaid - Yes; Tricare - yes  [place] ([address]) [facility] Allen, CPNP; Bates, MD; Gordon, MD; Mills, NP; Weinshilboum, DO [address], [place], [place] [address] [phone] M-F 8:00 - 5:15, Sat/Sun 9-12 sick visits only Medicaid - No; Tricare - yes  [facility] [place] [facility]  Brown, PA-C; Cowen, PA-C; Dennis, DO; Fuster, PA-C; Martin, PA-C; Shelton, PA-C; Spry, MD [address] [place],  [address] [phone] Mon-Thur 8:00-7:00, Fri 8:00-5:00 Accepting Medicaid for 13 and under only   [facility] [place] [facility] [place] Hayes, FNP; List, FNP; Moran, MD; Pitonzo, PA-C; Scholer,   MD; Spangle, FNP; Nzenwa, FNP; Jasper, MD; Moran, MD [address] [place], [place] [address] [phone] Mon-Fri 8:30-5:30 Medicaid - Yes; Tricare - yes  [facility] [address]  Kelly, CPNP; Logan, MD; Poth, MD; Ramadoss, MD; Staton, NP [address], [place], [place] [address] [phone] Mon-Thur 8:00-5:30, Fri 8:00-5:00, Sat 9:00-12:00 Medicaid - yes, Tricare - yes  [place] ([address])  [facility] [place] Masneri, DO; Meyers, MD; Nelson, PA [address], [place], [place] [address] [phone] Mon-Fri 8:00-5:00, closed for lunch 12-1 Medicaid - No; Tricare - yes  [facility] [place] McGowen, MD [address], [place], [place] [address] [phone] Mon-Fri 8:00-5:00 Medicaid - No; Tricare - yes  [facility] [place] MacDonald, MD; Nayak, MD; Kearns, MD; Jones, MD [address] [place] [address], [place], [place] [address] [phone] Mon-Fri 8:00-5:00 Medicaid- Yes; Tricare - yes  [place] ([address])  [facility] [place] Martin, PA-C; Tabori, MD [address], [place], [place] [address] [phone] Mon-Fri 8:00-5:00 Medicaid - No; Tricare - yes  [facility] [place]  Margin - CPNP [address], [place], [place] [address] [phone] Mon-Weds 8:00-6:00, Thurs-Fri 8:00-5:00, Sat 9:00-12:00 Medicaid - yes; Tricare - yes   [facility] [place] Aubuchon, MD; Brandon, PA [address] [place], [place] [address] [phone] Mon-Fri 8:00-5:00 Medicaid - yes; Tricare - yes  [place] [facility] Providers  [facility] [address], [place], [place] [address] [phone] M, Thur: 8am -8pm, Tues, Weds: 8am - 5pm; Fri: 8-1 Medicaid - Yes; Tricare -  yes  [facility] Mertz, MD; Johnson, MD; Wells, MD; Downs, PA; Hockenberger, PA [address], [place], [place] [address] [phone] M-F 8:30 - 5:00 Medicaid - Call office; Tricare -yes  [facility] Bonney, MD; Page, MD, Minter, MD; Mueller, PNP; Thomason, NP [address], [place], [place] [address] [phone] M-F 8:30 - 5:00, Sat/Sun 8:30 - 12:30 (sick visits) Medicaid - Call office; Tricare -yes  [facility] Lewis, MD; Shaub, PNP; Boylston, MD; Quaile, PA; Nonato, NP; Landon, CPNP [address], [place], [place] [address] [phone] M-F 8:30 - 5:00 Medicaid - Call office; Tricare - yes  [facility] [place] Cline, MD; Dvergsten, MD; Flores, MD; Kawatu, MD; Nogo, MD [address], [place], [place] [address] [phone] M-Thur: 8:00 - 5:00; Fri: 8:00 - 4:00 Medicaid - yes; Tricare - yes  [facility] [address] [place], [place], [place] [address] [phone] M-F: 8:30- 5:00, closed for lunch 12:30 - 1:00 Medicaid - yes; Tricare -yes  [facility] [place] [address], [place], [place] [address] [phone] M-F 8:00 - 5:00 Medicaid - yes; Tricare - yes  [facility] Johnson, DO; Rumball, DO; Wicker, NP [address] [place], [place], [place] [address] [phone] M-F 8:00 - 5:00, Closed 12-1 for lunch Medicaid - Call; Tricare - yes  [facility] Stein, MD [address], , [place] [address] [phone] M-F: 8:00-5:00, Sat: 8:00 - noon Medicaid - call; Tricare -yes  [place] [facility] Providers  [facility] Collins, FNP-C [address], [place], [place] [address] [phone] M-W: 8:00-5:00, Thur: 8:00 - 7:00, Fri: 8:00 - noon Medicaid - yes; Tricare - yes  [facility] [place] Adams, FNP [address], [place],   [place] [address] [phone] M-F 8:00 - 5:00, Closed for lunch 12-1 Medicaid -  yes; Tricare - yes  [place] [facility] Providers  [facility] Smith, FNP, Melvin, MD, Fay, FNP-C [address] [place] [address] [place], [place], [place] [address] [phone] M-T 8:00-5:00, Wed-Fri 7:00-6:00 Medicaid - Yes; Tricare -yes  [facility] [place] Civiletti, DO; [address], [place], [place] [address] [phone] M-F 8:00 - 5:00, closed for lunch 12-1 Medicaid - Yes; Tricare - yes  [facility]  Barnes, MD; Bergdolt, MD; Caulfield, MD; [place], MD; Fiscus, MD; Hoppens, MD; Kylstra, MD, McPherson, MD; Todd, MD; Prestwood, MD; Waters, MD; Wood, MD [address], [place], [place] [address] [phone] M-F 8:00-5:00 Medicaid - yes; Tricare - yes  [facility] Cheema, MD (speaks Punjabi and Hindi) [address] [place], [place] [address] [phone] M-F: 8:30 - 5:00, closed 12:30 - 1 for lunch Medicaid - Yes; Tricare -yes  [place] [facility] Providers  [facility] Loda, MD; Timberlake, MD; Burke, MD [address], [place], [place] [address] [phone] M-Th: 8:00 - 5:30, Fri: 8:00 - 12:00 Medicaid - yes; Tricare - yes  [facility] [place] Lookabill, NP; Meier, MD; Daffron, MD [address], [place], [place] [address] [phone] M-F: 8:00 - 5:00 Medicaid - yes; Tricare - yes  [facility] Busse, NP; Bowman, NP; Baune, NP; Entwistle, MD; Williams, MD, Huffman, NP, Ferguson, MD; Patel, DO [address], [place], [place] [address] [phone] M-F: 8:30 - 5:30p Medicaid - yes; Tricare - yes Other locations available as well  [facility] Rajan, MD; Wilson, MD; Morgan, PA-C, Domenech, PA-C; Myers, PA-C [address], [place], [place] [address] [phone] M-W: 8:00am - 7:00pm, Thurs: 8:00am - 8:00pm; Fri: 8:00am -  5:00pm, closed daily from 12-1 for lunch Medicaid - yes; Tricare - yes  [place] [facility] Providers  [facility] [place] Adams, MD; Crystal, FNP; Hadley, MD; Stokes, MD; Johnson, PNP; Brady, PA-C; West, PNP; Gardner, MD;  [address], [place], [place] [address] [phone] M - Fri: 8am - 5pm, Sat 9-noon Medicaid - Yes; Tricare -yes  [facility] [place] Nayak, MD; Jones, FNP; McDonald, MD; Kearns, MD [address] [place] [address], [place], [place] [phone] M-F 8:00 - 5:00 Medicaid - call; Tricare - yes  [facility] [place] Bell, MD; [place], PNP; Pinder, MD; Anderson, MD; Light, PA-C; Johnson, MD; Latta, MD; Saul, PNP; Rainey, MD; Clifford, MD; McClung, MD [address], [place], [place] [address] [phone] M-F 8:00am - 5:00pm; Sat. 9:00 - 11:00 Medicaid - yes; Tricare - yes  [facility] [place] Soldato-Couture, MD [address], [place], [place] [address] [phone] M-F 8:00 - 5:00 Medicaid - [place] Medicaid only; Tricare - yes  [facility] [place] Walker, MD; Davis, PNP; Ajizian, MD [address] [place] [address], [place], [place] [address] [phone] M-F 8:00 - 5:00 Medicaid - yes; Tricare - yes  [facility] [place] Barry, MD; Brown, MD, Forest, MD, Hazek, MD; Hoyle, MD; Smith, MD; [address] [place], [place], [place] [address] [phone] M-F: 8-5 Medicaid - yes; Tricare - yes  [facility] [place] Brady, Md; Dowlen, MD; [address] [place] [address], [place],  [address] [phone] M-F 8-5 Medicaid - yes; Tricare - yes  [facility] Kearns, MD; Nayak, MD;   [place]old[place]-Courture, MD; Pellam-Palmer, DNP; Herring, PNP [address], [place], [place] [address] [phone] M-F 8-5 Medicaid - yes; Tricare - yes  [facility] Weathers, MD;  Merritt, PA-C; Davis-PA-C; Warnimont, MD [address], [place], [place] [address] [phone] M-F 7am - 5 pm Medicaid - call; Tricare - yes  [facility] Hill, PNP; Erickson, MD; Robinson, MD [address], [place], [place] [address] [phone] M-F 8-5 Medicaid - yes; Tricare - yes  [facility] Kribbs, MD; Warner, MD; Williams, FNP; Brooks, FNP; Boles, FNP; Romblad, PA-C; [place] - FNP [address], [place], [place] [address] [phone] M-F 8-5 Medicaid- yes; Tricare - yes  [facility] [place] [place] Yoder, MD; Verenes, MD; Armentrout, MD; Stewart, MD; Beasley, CPNP; Ford, MD; Erickson, MD; Rice, MD [address], [place], [place] [address] [phone] M-F: 8-5, Sat: 9-4, Sun 9-12 Medicaid - yes; Tricare - yes  [facility] Little, PNP; Davis, PNP [address], [place], [place] [address] [phone] M-F 8 - 5, closed 12-1 for lunch Medicaid - yes; Tricare - yes  [facility] Friesen, MD; Cnegia, MD; Rice, MD; Patel, DO [address], [place], [place] [address] [phone] M-F 8- 5:30 Medicaid - yes; Tricare - yes  [facility] [place] Zvolensky, MD; Ray, MD; Haas, MD [address] [address], [place], [place] [address] [phone] M: 8-7; Tues-Fri: 8-5; Sat: 9-12 Medicaid - yes; Tricare - yes  [facility] [place] Heinrich, MD; Meyer, MD; Clark, MD; Rhyne, MD; Aubuchon, MD [address], [place], [place] [address] [phone] M: 8-7; Tues-Fri: 8-5; Sat: 8:30-12:30 Medicaid - yes; Tricare - yes  [facility] [place] Bista, MD; Dillard, PA [address], [place], [place] [address] [phone] Mon-Fri: 8-5 Medicaid - yes;  Tricare - yes  [facility] [place] Beasley, CPNP; Mahle, CPNP; Rice, MD; Duffy, MD; Culler, MD; [address], [place], [place] [address] [phone] M-F: 8-5, closed 1-2 for lunch Medicaid - yes; Tricare - yes  [facility] Rickman, PA; Mounce, NP; Smith, MD; Jordan, CPNP; Darty, PA; Ball, MD; Wallace, MD [address] [place] [address], [place], [place] [address] [phone] M-Thurs: 8-7; Fri: 8-6; Sat: 9-12; Sun 2-4 Medicaid - yes; Tricare - yes  [facility] Brown, MD; Shin, MD; Goodman, DNP, FNP; Sebesta, DO; [address], [place], [place] [address] [phone] M-F: 8-5 Medicaid - yes; Tricare - yes  [place] [facility] Providers  [facility] Dough, MD; Welsh, NP [address], [place], [place] [address] [phone] M - Fri: 8am - 5pm, closed for lunch 12-1 Medicaid - Yes; Tricare - yes  [facility] Manandhar, MD; Riley, MD; Sanger, DO; Vinocur, MD;Hall, PA; Walsh, PA; Campbell, NP [address], [place] [address] [phone] M-F 8:00 - 5:00, Sat 8:00 - 11:30 Medicaid - yes; Tricare - yes  [facility] Khan, MD; Redding, MD, Street, MD, Holt, MD, Burgart, MD; Rhyne, NP; Dickinson, PA;  [address] [place] [address], , [place] [address] [phone] M-F 8:10am - 5:00pm Medicaid - yes; Tricare - yes    [facility] Connors, MD; Kime, NP [address], [place], [place] [address] [phone] M-F 8:00 - 5:00 Medicaid - [employer] Medicaid only; Tricare - yes  [facility] [place] Whyte, MD; Fox, NP [address], [place], [place] [address] [phone] M-F 8:00 - 5:00; Closed for lunch 12 - 1:00 Medicaid - yes;  Tricare - yes  [facility] Penner, MD; Wilburn, FNP [address], [place], [place] [address] [phone] Mon 9-5; Tues/Wed 10-5; Thurs 8:30-5; Fri: 8-12:30 Medicaid - yes; Tricare - yes  [place] [facility] Providers  [facility]  Golding, MD; Jackson, PA-C [address], [place], [place] [address] [phone] phone [phone] fax M-F 7:15 - 4:30 Medicaid - yes; Tricare - yes  [facility] Gosrani, MD; Meccariello, DO [address] [place], [place] [address] [phone] M-Fri: 8:30 - 5:00, closed for lunch everyday noon - 1pm Medicaid - Yes; Tricare - yes  [facility] Burdine, MD; Daniel, MD; Howard, MD; Sasser, MD; Boles, PA; Boyd, PA-C; Carroll, PA; McGee, PA; Skillman, PA; Wilson, PA [address] [place], [place] [address] [phone] M-Thurs: 7:30am - 7:00pm; Friday 7:30am - 4pm; Sat: 8:00 - 1:00 Medicaid - Yes; Tricare - yes  [facility] [place] Akhbari, MD; Law, MD; Qayumi, MD; Salvador, DO [address], [place], [place] [address] [phone] M-Thur: 8:00 - 5:00, Fri: 8:00 - Noon Medicaid - yes; Tricare - yes No [place] [employer]  [facility] [place] [facility] Dettinger, MD; Gottschalk, DO; Hawks, NP; Martin, NP; Morgan, NP; Milian, NP; Rakes, NP; Stacks, MD; Webster, PA [address], [place], [place] [address] [phone] M-F 8:00 - 5:00 Medicaid - yes; Tricare - yes  [facility] Collins, FNP-C; Bucio, FNP-C [address], [place], [place] [address] [phone] M, W, R 8:00-5:00, Tues: 8:00am - 7:00pm; Fri 8:00 - noon Medicaid - Yes; Tricare - yes  [facility] Khan, MD [address] [place] [address] [place],  [address] [phone]  M-Thurs 8:30-5:30, Fri: 8:30-12:30pm Medicaid - Yes; Tricare - N  

## 2022-05-04 NOTE — Progress Notes (Signed)
New OB Intake  I connected with Judy Underwood  on 05/04/22 at  9:15 AM EST by MyChart Video Visit and verified that I am speaking with the correct person using two identifiers. Nurse is located at [facility] and pt is located at home.  I discussed the limitations, risks, security and privacy concerns of performing an evaluation and management service by telephone and the availability of in person appointments. I also discussed with the patient that there may be a patient responsible charge related to this service. The patient expressed understanding and agreed to proceed.  I explained I am completing New OB Intake today. We discussed EDD of 12/07/22 that is based on LMP of 125/23. Pt is G2/P1. I reviewed her allergies, medications, Medical/Surgical/OB history, and appropriate screenings. I informed her of [facility] services. [facility] information placed in AVS. Based on history, this is a low risk pregnancy.  Patient Active Problem List   Diagnosis Date Noted   Anemia due to acute blood loss 05/21/2021   Spontaneous vaginal delivery 05/20/2021   Periurethral laceration, delivered, current hospitalization 05/20/2021   COVID-19 05/20/2021   Elevated blood pressure affecting pregnancy in third trimester, antepartum 05/19/2021   GBS bacteriuria 03/12/2021   Supervision of normal first pregnancy, antepartum 10/27/2020   Psychosocial stressors 07/16/2013    Concerns addressed today  Delivery Plans Plans to deliver at [facility]. Patient given information for [facility] Healthy Baby website for more information about [facility] and [place]. Patient is not interested in water birth. Offered upcoming OB visit with CNM to discuss further.  MyChart/Babyscripts MyChart access verified. I explained pt will have some visits in office and some virtually. Babyscripts instructions given and order placed. Patient verifies receipt of registration text/e-mail. Account successfully created and app downloaded.  Blood  Pressure Cuff/Weight Scale Blood pressure cuff ordered for patient to pick-up from [employer]. Explained after first prenatal appt pt will check weekly and document in 15. Patient does have weight scale.  Anatomy US Explained first scheduled [place] will be around 19 weeks. Anatomy US scheduled for 07/13/22 at 0945a. Pt notified to arrive at 0930a.  Labs Discussed Johnsie Cancel genetic screening with patient. Would like both Panorama and Horizon drawn at new OB visit. Routine prenatal labs needed.  COVID Vaccine Patient has had COVID vaccine.   Is patient a CenteringPregnancy candidate?  Declined Declined due to Group setting      Is patient a Mom+Baby Combined Care candidate?  Not a candidate     Social Determinants of Health Food Insecurity: Patient denies food insecurity. WIC Referral: Patient is interested in referral to [facility].  Transportation: Patient denies transportation needs. Childcare: Discussed no children allowed at ultrasound appointments. Offered childcare services; patient declines childcare services at this time.  Interested in [place]? If yes, send referral.   First visit review I reviewed new OB appt with patient.  Explained pt will be seen by Dr. Ilda Basset at first visit; encounter routed to appropriate provider. Explained that patient will be seen by pregnancy navigator following visit with provider.   Judy Underwood, CMA 05/04/2022  9:26 AM

## 2022-05-12 ENCOUNTER — Encounter: Payer: Medicaid Other | Admitting: Obstetrics and Gynecology

## 2022-05-24 ENCOUNTER — Encounter: Payer: Medicaid Other | Admitting: Advanced Practice Midwife

## 2022-05-31 ENCOUNTER — Other Ambulatory Visit (HOSPITAL_COMMUNITY)
Admission: RE | Admit: 2022-05-31 | Discharge: 2022-05-31 | Disposition: A | Discharge status: Home / Self Care | Payer: Commercial Managed Care - HMO | Source: Ambulatory Visit | Attending: Obstetrics and Gynecology | Admitting: Obstetrics and Gynecology

## 2022-05-31 ENCOUNTER — Other Ambulatory Visit: Payer: Self-pay

## 2022-05-31 ENCOUNTER — Ambulatory Visit (INDEPENDENT_AMBULATORY_CARE_PROVIDER_SITE_OTHER): Payer: Commercial Managed Care - HMO | Admitting: Certified Nurse Midwife

## 2022-05-31 VITALS — BP 127/75 | HR 84 | Ht 59.0 in | Wt 189.5 lb

## 2022-05-31 DIAGNOSIS — Z3A12 12 weeks gestation of pregnancy: Secondary | ICD-10-CM | POA: Diagnosis not present

## 2022-05-31 DIAGNOSIS — R519 Headache, unspecified: Secondary | ICD-10-CM | POA: Diagnosis not present

## 2022-05-31 DIAGNOSIS — D563 Thalassemia minor: Secondary | ICD-10-CM | POA: Diagnosis not present

## 2022-05-31 DIAGNOSIS — Z23 Encounter for immunization: Secondary | ICD-10-CM | POA: Diagnosis not present

## 2022-05-31 DIAGNOSIS — Z3480 Encounter for supervision of other normal pregnancy, unspecified trimester: Secondary | ICD-10-CM

## 2022-05-31 DIAGNOSIS — Z124 Encounter for screening for malignant neoplasm of cervix: Secondary | ICD-10-CM

## 2022-05-31 DIAGNOSIS — Z8759 Personal history of other complications of pregnancy, childbirth and the puerperium: Secondary | ICD-10-CM | POA: Diagnosis not present

## 2022-05-31 DIAGNOSIS — O26891 Other specified pregnancy related conditions, first trimester: Secondary | ICD-10-CM | POA: Diagnosis not present

## 2022-05-31 DIAGNOSIS — Z3491 Encounter for supervision of normal pregnancy, unspecified, first trimester: Secondary | ICD-10-CM

## 2022-05-31 MED ORDER — MAG-OXIDE 200 MG PO TABS: 400.0000 mg | ORAL_TABLET | Freq: Every day | ORAL | 3 refills | Status: DC

## 2022-05-31 MED ORDER — ASPIRIN 81 MG PO TBEC: 81.0000 mg | DELAYED_RELEASE_TABLET | Freq: Every day | ORAL | 12 refills | Status: DC

## 2022-05-31 NOTE — Progress Notes (Signed)
History:   Judy Underwood is a 22 y.o. G2P1001 at 28w6dby LMP being seen today for her first obstetrical visit.  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 Patient does not intend to breast feed. Pregnancy history fully reviewed.  Patient reports  nausea with daily vomiting in the mornings, phenergan helped the one time she took it. Also having a lot of headaches that typically go away on their own or with Tylenol HISTORY: OB History  Gravida Para Term Preterm AB Living  '2 1 1 '$ 0 0 1  SAB IAB Ectopic Multiple Live Births  0 0 0 0 1    # Outcome Date GA Lbr Len/2nd Weight Sex Delivery Anes PTL Lv  2 Current           1 Term 05/20/21 [id number]:[id number] 7 lb 9.5 oz (3.445 kg) F Vag-Spont EPI  LIV     Name: Grieb,GIRL [place]     Apgar1: 9  Apgar5: 9    No pap history (age), will complete today.  Past Medical History:  Diagnosis Date   Breast lump on left side at 4 o'clock position 06/15/2018   Medical history non-contributory    Pregnancy induced hypertension    Sickle cell trait ([place]04/20/2015   Supervision of normal first pregnancy, antepartum 10/27/2020          Nursing Staff  Provider  Office Location   [place]    LMP  Language   English  Anatomy US[place]  normal with FUP for growth  Flu Vaccine   03/12/21  Genetic/Carrier Screen   NIPS: LR Female    AFP:     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  TDaP Vaccine    03/12/21  Hgb A1C or   GTT  Early   Third trimester   Glucose, Fasting  70 - 91 mg/dL  85   Glucose, 1 hour  70 - 179 mg/dL  151   Gluc   Past Surgical History:  Procedure Laterality Date   NO PAST SURGERIES     Family History  Problem Relation Age of Onset   Asthma Neg Hx    Eczema Neg Hx    Diabetes Neg Hx    Hypertension Neg Hx    Social History   Tobacco Use   Smoking status: Never    Passive exposure: Never   Smokeless tobacco: Never  Vaping Use   Vaping Use:  Never used  Substance Use Topics   Alcohol use: Never    Alcohol/week: 0.0 standard drinks of alcohol   Drug use: Never   No Known Allergies Current Outpatient Medications on File Prior to Visit  Medication Sig Dispense Refill   Blood Pressure Monitoring DEVI 1 each by Does not apply route once a week. 1 each 0   prenatal vitamin w/FE, FA (PRENATAL 1 + 1) 27-1 MG TABS tablet Take 1 tablet by mouth daily at 12 noon.     promethazine (PHENERGAN) 25 MG tablet Take 1 tablet (25 mg total) by mouth every 6 (six) hours as needed for nausea or vomiting. 30 tablet 0   No current facility-administered medications on file prior to visit.   Review of Systems Pertinent items noted in HPI and remainder of comprehensive ROS otherwise negative.  Physical Exam:   Vitals:   05/31/22 1414  BP: 127/75  Pulse: 84  Weight: 189 lb 8 oz (86 kg)  Fetal Heart Rate (bpm): 164  Bedside Ultrasound: Patient informed that the ultrasound is considered a limited obstetric ultrasound and is not intended to be a complete ultrasound exam.  Patient also informed that the ultrasound is not being completed with the intent of assessing for fetal or placental anomalies or any pelvic abnormalities.  Explained that the purpose of today's ultrasound is to assess for singleton vs multiple gestation (per pt request).  Patient acknowledges the purpose of the exam and the limitations of the study.  Viable singleton intrauterine pregnancy with positive cardiac activity noted  Constitutional: Well-developed, well-nourished pregnant female in no acute distress.  HEENT: PERRLA Skin: normal color and turgor, no rash Cardiovascular: normal rate & rhythm, warm and well perfused Respiratory: normal effort, no problems with respiration noted GI: Abd soft, non-tender, non-distended MS: Extremities nontender, no edema, normal ROM Neurologic: Alert and oriented x 4.  GU: no CVA tenderness Pelvic: NEFG, physiologic discharge, no  blood, cervix clean. Pap/swabs collected  Assessment:    Pregnancy: G2P1001 Patient Active Problem List   Diagnosis Date Noted   Alpha thalassemia silent carrier 05/31/2022   History of pre-eclampsia 05/31/2022   Supervision of other normal pregnancy, antepartum 05/04/2022     Plan:  Encounter for supervision of low-risk pregnancy in first trimester - Plan: Hemoglobin A1c, Culture, OB Urine, CBC/D/Plt+RPR+Rh+ABO+RubIgG..., Panorama Prenatal Test Full Panel, CHL AMB BABYSCRIPTS SCHEDULE OPTIMIZATION  [redacted] weeks gestation of pregnancy  Alpha thalassemia silent carrier  Cervical cancer screening - Plan: Cytology - PAP( [place])  History of pre-eclampsia - Plan: CMP14+EGFR, Protein / creatinine ratio, urine, aspirin EC 81 MG tablet  Pregnancy headache in first trimester - Plan: Magnesium Oxide -Mg Supplement (MAG-OXIDE) 200 MG TABS  Initial obstetric visit in first trimester  - Initial labs drawn. - Continue prenatal vitamins. - Problem list reviewed and updated. - Genetic Screening discussed, First trimester screen, Quad screen, and NIPS: ordered. - Ultrasound discussed; fetal anatomic survey: ordered. - Anticipatory guidance about prenatal visits given including labs, ultrasounds, and testing. - Discussed usage of Babyscripts and virtual visits as additional source of managing and completing prenatal visits in midst of coronavirus and pandemic.   - Encouraged to complete MyChart Registration for her ability to review results, send requests, and have questions addressed.  - The nature of [place] for [facility] Healthcare/Faculty Practice with multiple MDs and Advanced Practice Providers was explained to patient; also emphasized that residents, students are part of our team. - Routine obstetric precautions reviewed. Encouraged to seek out care at office or emergency room [facility] MAU preferred) for urgent and/or emergent concerns.  Return in about 4 weeks (around 06/28/2022)  for IN-PERSON, LOB.    Gaylan Gerold, MSN, CNM, [place] Certified Nurse Midwife, [facility]

## 2022-06-01 LAB — CBC/D/PLT+RPR+RH+ABO+RUBIGG...
Antibody Screen: NEGATIVE
Basophils Absolute: 0 10*3/uL (ref 0.0–0.2)
Basos: 0 %
EOS (ABSOLUTE): 0.1 10*3/uL (ref 0.0–0.4)
Eos: 1 %
HCV Ab: NONREACTIVE
HIV Screen 4th Generation wRfx: NONREACTIVE
Hematocrit: 40.4 % (ref 34.0–46.6)
Hemoglobin: 13 g/dL (ref 11.1–15.9)
Hepatitis B Surface Ag: NEGATIVE
Immature Grans (Abs): 0 10*3/uL (ref 0.0–0.1)
Immature Granulocytes: 0 %
Lymphocytes Absolute: 1.9 10*3/uL (ref 0.7–3.1)
Lymphs: 20 %
MCH: 27.6 pg (ref 26.6–33.0)
MCHC: 32.2 g/dL (ref 31.5–35.7)
MCV: 86 fL (ref 79–97)
Monocytes Absolute: 0.4 10*3/uL (ref 0.1–0.9)
Monocytes: 4 %
Neutrophils Absolute: 7.4 10*3/uL — ABNORMAL HIGH (ref 1.4–7.0)
Neutrophils: 75 %
Platelets: 315 10*3/uL (ref 150–450)
RBC: 4.71 x10E6/uL (ref 3.77–5.28)
RDW: 12.3 % (ref 11.7–15.4)
RPR Ser Ql: NONREACTIVE
Rh Factor: POSITIVE
Rubella Antibodies, IGG: 8.26 index (ref >0.99)
WBC: 9.8 10*3/uL (ref 3.4–10.8)

## 2022-06-01 LAB — CMP14+EGFR
ALT: 9 IU/L (ref 0–32)
AST: 12 IU/L (ref 0–40)
Albumin/Globulin Ratio: 1.2 (ref 1.2–2.2)
Albumin: 4.4 g/dL (ref 4.0–5.0)
Alkaline Phosphatase: 82 IU/L (ref 44–121)
BUN/Creatinine Ratio: 13 (ref 9–23)
BUN: 8 mg/dL (ref 6–20)
Bilirubin Total: 0.2 mg/dL (ref 0.0–1.2)
CO2: 16 mmol/L — ABNORMAL LOW (ref 20–29)
Calcium: 10.6 mg/dL — ABNORMAL HIGH (ref 8.7–10.2)
Chloride: 101 mmol/L (ref 96–106)
Creatinine, Ser: 0.62 mg/dL (ref 0.57–1.00)
Globulin, Total: 3.7 g/dL (ref 1.5–4.5)
Glucose: 73 mg/dL (ref 70–99)
Potassium: 4.3 mmol/L (ref 3.5–5.2)
Sodium: 138 mmol/L (ref 134–144)
Total Protein: 8.1 g/dL (ref 6.0–8.5)
eGFR: 129 mL/min/{1.73_m2} (ref >59)

## 2022-06-01 LAB — CYTOLOGY - PAP
Chlamydia: NEGATIVE
Comment: NEGATIVE
Comment: NEGATIVE
Comment: NORMAL
Diagnosis: NEGATIVE
High risk HPV: NEGATIVE
Neisseria Gonorrhea: NEGATIVE

## 2022-06-01 LAB — PROTEIN / CREATININE RATIO, URINE
Creatinine, Urine: 100.2 mg/dL
Protein, Ur: 10.2 mg/dL
Protein/Creat Ratio: 102 mg/g creat (ref 0–200)

## 2022-06-01 LAB — HEMOGLOBIN A1C
Est. average glucose Bld gHb Est-mCnc: 94 mg/dL
Hgb A1c MFr Bld: 4.9 % (ref 4.8–5.6)

## 2022-06-01 LAB — HCV INTERPRETATION

## 2022-06-02 LAB — URINE CULTURE, OB REFLEX

## 2022-06-02 LAB — CULTURE, OB URINE

## 2022-06-06 LAB — PANORAMA PRENATAL TEST FULL PANEL:PANORAMA TEST PLUS 5 ADDITIONAL MICRODELETIONS: FETAL FRACTION: 7.3

## 2022-06-28 ENCOUNTER — Encounter: Payer: Commercial Managed Care - HMO | Admitting: Family Medicine

## 2022-07-06 ENCOUNTER — Encounter: Payer: Medicaid Other | Admitting: Advanced Practice Midwife

## 2022-07-13 ENCOUNTER — Ambulatory Visit: Payer: Medicaid Other

## 2022-07-13 ENCOUNTER — Ambulatory Visit: Payer: Medicaid Other | Attending: Obstetrics and Gynecology

## 2022-08-04 ENCOUNTER — Telehealth: Payer: Self-pay

## 2022-08-04 NOTE — Telephone Encounter (Signed)
Alert received on 07/18/2022 through NCNotify system as follows: Patient identified as not having a recommended prenatal visit. Patient is 19 weeks and 5 days pregnant. Most recent prenatal visit was on 05/31/2022 at [facility].  Chart review: Patient's last visit was on 05/31/2022.  Action needed: attempted to call patient. LVM to return call to our office to get future prenatal appointments scheduled.   Lowry Bowl, CMA 08/04/2022  2:43 PM

## 2022-08-10 ENCOUNTER — Ambulatory Visit (INDEPENDENT_AMBULATORY_CARE_PROVIDER_SITE_OTHER): Payer: Medicaid Other | Admitting: Family Medicine

## 2022-08-10 VITALS — BP 111/61 | HR 89 | Wt 189.0 lb

## 2022-08-10 DIAGNOSIS — Z3A23 23 weeks gestation of pregnancy: Secondary | ICD-10-CM

## 2022-08-10 DIAGNOSIS — Z3491 Encounter for supervision of normal pregnancy, unspecified, first trimester: Secondary | ICD-10-CM

## 2022-08-10 DIAGNOSIS — Z8759 Personal history of other complications of pregnancy, childbirth and the puerperium: Secondary | ICD-10-CM

## 2022-08-10 DIAGNOSIS — Z3480 Encounter for supervision of other normal pregnancy, unspecified trimester: Secondary | ICD-10-CM

## 2022-08-10 NOTE — Progress Notes (Signed)
   PRENATAL VISIT NOTE  Subjective:  Judy Underwood is a 22 y.o. G2P1001 at [redacted]w[redacted]d being seen today for ongoing prenatal care.  She is currently monitored for the following issues for this low-risk pregnancy and has Supervision of other normal pregnancy, antepartum; Alpha thalassemia silent carrier; and History of pre-eclampsia on their problem list.  Patient reports  sciatica .  Contractions: Not present. Vag. Bleeding: None.  Movement: Present. Denies leaking of fluid.   The following portions of the patient's history were reviewed and updated as appropriate: allergies, current medications, past family history, past medical history, past social history, past surgical history and problem list.   Objective:   Vitals:   08/10/22 1628  BP: 111/61  Pulse: 89  Weight: 189 lb (85.7 kg)    Fetal Status: Fetal Heart Rate (bpm): 150   Movement: Present     General:  Alert, oriented and cooperative. Patient is in no acute distress.  Skin: Skin is warm and dry. No rash noted.   Cardiovascular: Normal heart rate noted  Respiratory: Normal respiratory effort, no problems with respiration noted  Abdomen: Soft, gravid, appropriate for gestational age.  Pain/Pressure: Present     Pelvic: Cervical exam deferred        Extremities: Normal range of motion.  Edema: None  Mental Status: Normal mood and affect. Normal behavior. Normal judgment and thought content.   Assessment and Plan:  Pregnancy: G2P1001 at [redacted]w[redacted]d  1. Supervision of other normal pregnancy, antepartum  2. History of pre-eclampsia  Not taking asa, counseled.  Rescheduled US anatomy.   Preterm labor symptoms and general obstetric precautions including but not limited to vaginal bleeding, contractions, leaking of fluid and fetal movement were reviewed in detail with the patient. Please refer to After Visit Summary for other counseling recommendations.   No follow-ups on file.  Future Appointments  Date Time Provider  Department Center  09/08/2022  8:15 AM Federico Flake, MD [facility]  09/08/2022  8:50 AM [facility]    Myrtie Hawk, DO FMOB Fellow, Faculty practice [facility], [facility] for [facility] 08/10/22  4:36 PM

## 2022-09-08 ENCOUNTER — Ambulatory Visit (INDEPENDENT_AMBULATORY_CARE_PROVIDER_SITE_OTHER): Payer: Medicaid Other | Admitting: Certified Nurse Midwife

## 2022-09-08 ENCOUNTER — Other Ambulatory Visit: Payer: Self-pay

## 2022-09-08 VITALS — BP 99/64 | HR 87 | Wt 186.0 lb

## 2022-09-08 DIAGNOSIS — Z3480 Encounter for supervision of other normal pregnancy, unspecified trimester: Secondary | ICD-10-CM

## 2022-09-08 DIAGNOSIS — R102 Pelvic and perineal pain: Secondary | ICD-10-CM

## 2022-09-08 DIAGNOSIS — Z23 Encounter for immunization: Secondary | ICD-10-CM

## 2022-09-08 DIAGNOSIS — Z3482 Encounter for supervision of other normal pregnancy, second trimester: Secondary | ICD-10-CM | POA: Diagnosis not present

## 2022-09-08 DIAGNOSIS — Z3A27 27 weeks gestation of pregnancy: Secondary | ICD-10-CM

## 2022-09-08 DIAGNOSIS — Z8759 Personal history of other complications of pregnancy, childbirth and the puerperium: Secondary | ICD-10-CM

## 2022-09-08 DIAGNOSIS — O26899 Other specified pregnancy related conditions, unspecified trimester: Secondary | ICD-10-CM

## 2022-09-08 DIAGNOSIS — R519 Headache, unspecified: Secondary | ICD-10-CM

## 2022-09-08 DIAGNOSIS — O26892 Other specified pregnancy related conditions, second trimester: Secondary | ICD-10-CM

## 2022-09-08 DIAGNOSIS — O26891 Other specified pregnancy related conditions, first trimester: Secondary | ICD-10-CM

## 2022-09-08 MED ORDER — ASPIRIN 81 MG PO TBEC: 81.0000 mg | DELAYED_RELEASE_TABLET | Freq: Every day | ORAL | 6 refills | Status: DC

## 2022-09-08 MED ORDER — MAG-OXIDE 200 MG PO TABS: 400.0000 mg | ORAL_TABLET | Freq: Every day | ORAL | 3 refills | Status: DC

## 2022-09-08 MED ORDER — PRENATAL PLUS VITAMIN/MINERAL 27-1 MG PO TABS: 1.0000 | ORAL_TABLET | Freq: Every day | ORAL | 11 refills | Status: DC

## 2022-09-08 MED ORDER — BLOOD PRESSURE MONITORING DEVI: 1.0000 | 0 refills | Status: DC

## 2022-09-08 NOTE — Progress Notes (Signed)
   PRENATAL VISIT NOTE  Subjective:  Judy Underwood is a 22 y.o. G2P1001 at [redacted]w[redacted]d being seen today for ongoing prenatal care.  She is currently monitored for the following issues for this low-risk pregnancy and has Supervision of other normal pregnancy, antepartum; Alpha thalassemia silent carrier; and History of pre-eclampsia on their problem list.  Patient reports  sharp bilateral lower abdominal pains aggravated by movement, has not started taking her PNV or aspirin because she does not have transportation to the pharmacy .  Contractions: Irritability. Vag. Bleeding: None.  Movement: Present. Denies leaking of fluid.   The following portions of the patient's history were reviewed and updated as appropriate: allergies, current medications, past family history, past medical history, past social history, past surgical history and problem list.   Objective:   Vitals:   09/08/22 0829  BP: 99/64  Pulse: 87  Weight: 186 lb (84.4 kg)   Fetal Status: Fetal Heart Rate (bpm): 135 Fundal Height: 27 cm Movement: Present     General:  Alert, oriented and cooperative. Patient is in no acute distress.  Skin: Skin is warm and dry. No rash noted.   Cardiovascular: Normal heart rate noted  Respiratory: Normal respiratory effort, no problems with respiration noted  Abdomen: Soft, gravid, appropriate for gestational age.  Pain/Pressure: Present     Pelvic: Cervical exam deferred        Extremities: Normal range of motion.  Edema: Trace  Mental Status: Normal mood and affect. Normal behavior. Normal judgment and thought content.   Assessment and Plan:  Pregnancy: G2P1001 at [redacted]w[redacted]d 1. Supervision of other normal pregnancy, antepartum - Doing well overall, feeling regular and vigorous fetal movement  - Will refill all meds and send to [facility] so they can be delivered to her - Tdap vaccine greater than or equal to 7yo IM - Prenatal Vit-Fe Fumarate-FA (PRENATAL PLUS VITAMIN/MINERAL) 27-1  MG TABS; Take 1 tablet by mouth daily.  Dispense: 30 tablet; Refill: 11 - Blood Pressure Monitoring DEVI; 1 each by Does not apply route once a week.  Dispense: 1 each; Refill: 0  2. [redacted] weeks gestation of pregnancy - Routine OB care including GTT today  3. Pregnancy headache in first trimester - None today but refilling  - Magnesium Oxide -Mg Supplement (MAG-OXIDE) 200 MG TABS; Take 2 tablets (400 mg total) by mouth at bedtime. If that amount causes loose stools in the am, switch to 200mg  daily at bedtime.  Dispense: 60 tablet; Refill: 3  4. History of pre-eclampsia - aspirin EC 81 MG tablet; Take 1 tablet (81 mg total) by mouth daily. Swallow whole.  Dispense: 30 tablet; Refill: 6  5. Pelvic pain during pregnancy - Round ligament pains, massage and stretching demonstrated - Given support band (RN assisted with putting it on)  Preterm labor symptoms and general obstetric precautions including but not limited to vaginal bleeding, contractions, leaking of fluid and fetal movement were reviewed in detail with the patient. Please refer to After Visit Summary for other counseling recommendations.   Return in about 2 weeks (around 09/22/2022) for IN-PERSON, LOB.  Future Appointments  Date Time Provider Department Center  09/15/2022 12:45 PM [facility] NURSE [facility]  09/15/2022  1:00 PM [facility] US1 [facility]  09/22/2022  2:35 PM Bernerd Limbo, CNM [facility]   Bernerd Limbo, CNM

## 2022-09-09 LAB — CBC
Hematocrit: 34.3 % (ref 34.0–46.6)
Hemoglobin: 10.9 g/dL — ABNORMAL LOW (ref 11.1–15.9)
MCH: 27.3 pg (ref 26.6–33.0)
MCHC: 31.8 g/dL (ref 31.5–35.7)
MCV: 86 fL (ref 79–97)
Platelets: 256 10*3/uL (ref 150–450)
RBC: 3.99 x10E6/uL (ref 3.77–5.28)
RDW: 13.1 % (ref 11.7–15.4)
WBC: 9.6 10*3/uL (ref 3.4–10.8)

## 2022-09-09 LAB — HIV ANTIBODY (ROUTINE TESTING W REFLEX): HIV Screen 4th Generation wRfx: NONREACTIVE

## 2022-09-09 LAB — RPR: RPR Ser Ql: NONREACTIVE

## 2022-09-09 LAB — GLUCOSE TOLERANCE, 2 HOURS W/ 1HR
Glucose, 1 hour: 113 mg/dL (ref 70–179)
Glucose, 2 hour: 83 mg/dL (ref 70–152)
Glucose, Fasting: 71 mg/dL (ref 70–91)

## 2022-09-15 ENCOUNTER — Ambulatory Visit: Payer: Medicaid Other | Attending: Family Medicine

## 2022-09-15 ENCOUNTER — Other Ambulatory Visit: Payer: Self-pay | Admitting: *Deleted

## 2022-09-15 ENCOUNTER — Ambulatory Visit: Payer: Medicaid Other | Admitting: *Deleted

## 2022-09-15 ENCOUNTER — Encounter: Payer: Self-pay | Admitting: *Deleted

## 2022-09-15 VITALS — BP 104/54 | HR 85

## 2022-09-15 DIAGNOSIS — O99213 Obesity complicating pregnancy, third trimester: Secondary | ICD-10-CM

## 2022-09-15 DIAGNOSIS — Z363 Encounter for antenatal screening for malformations: Secondary | ICD-10-CM | POA: Diagnosis not present

## 2022-09-15 DIAGNOSIS — Z3A27 27 weeks gestation of pregnancy: Secondary | ICD-10-CM | POA: Diagnosis not present

## 2022-09-15 DIAGNOSIS — Z3491 Encounter for supervision of normal pregnancy, unspecified, first trimester: Secondary | ICD-10-CM | POA: Diagnosis not present

## 2022-09-15 DIAGNOSIS — O99212 Obesity complicating pregnancy, second trimester: Secondary | ICD-10-CM

## 2022-09-21 ENCOUNTER — Telehealth: Payer: Self-pay

## 2022-09-21 NOTE — Telephone Encounter (Signed)
Left message for patient regarding follow up ultrasound appointment scheduled for Wednesday 10/13/22 arrive at 3:30pm

## 2022-09-21 NOTE — Progress Notes (Unsigned)
Pt canceled appt. 

## 2022-09-22 ENCOUNTER — Ambulatory Visit: Payer: Self-pay | Admitting: Certified Nurse Midwife

## 2022-09-22 DIAGNOSIS — Z8759 Personal history of other complications of pregnancy, childbirth and the puerperium: Secondary | ICD-10-CM

## 2022-09-22 DIAGNOSIS — Z3493 Encounter for supervision of normal pregnancy, unspecified, third trimester: Secondary | ICD-10-CM

## 2022-09-22 DIAGNOSIS — Z3A29 29 weeks gestation of pregnancy: Secondary | ICD-10-CM

## 2022-09-29 ENCOUNTER — Ambulatory Visit: Payer: Self-pay | Admitting: Certified Nurse Midwife

## 2022-09-29 DIAGNOSIS — Z8759 Personal history of other complications of pregnancy, childbirth and the puerperium: Secondary | ICD-10-CM

## 2022-09-29 DIAGNOSIS — Z3A30 30 weeks gestation of pregnancy: Secondary | ICD-10-CM

## 2022-09-29 DIAGNOSIS — Z3493 Encounter for supervision of normal pregnancy, unspecified, third trimester: Secondary | ICD-10-CM

## 2022-09-30 NOTE — Progress Notes (Signed)
Pt did not come to visit.

## 2022-10-05 ENCOUNTER — Encounter (HOSPITAL_COMMUNITY): Payer: Self-pay | Admitting: Obstetrics and Gynecology

## 2022-10-05 ENCOUNTER — Inpatient Hospital Stay (HOSPITAL_COMMUNITY)
Admission: AD | Admit: 2022-10-05 | Discharge: 2022-10-06 | Disposition: A | Discharge status: Home / Self Care | Payer: Medicaid Other | Attending: Obstetrics and Gynecology | Admitting: Obstetrics and Gynecology

## 2022-10-05 DIAGNOSIS — K219 Gastro-esophageal reflux disease without esophagitis: Secondary | ICD-10-CM | POA: Diagnosis not present

## 2022-10-05 DIAGNOSIS — R0789 Other chest pain: Secondary | ICD-10-CM | POA: Diagnosis not present

## 2022-10-05 DIAGNOSIS — R109 Unspecified abdominal pain: Secondary | ICD-10-CM | POA: Insufficient documentation

## 2022-10-05 DIAGNOSIS — D563 Thalassemia minor: Secondary | ICD-10-CM

## 2022-10-05 DIAGNOSIS — O26893 Other specified pregnancy related conditions, third trimester: Secondary | ICD-10-CM | POA: Diagnosis not present

## 2022-10-05 DIAGNOSIS — Z3A31 31 weeks gestation of pregnancy: Secondary | ICD-10-CM | POA: Insufficient documentation

## 2022-10-05 MED ORDER — PANTOPRAZOLE SODIUM 20 MG PO TBEC: 20.0000 mg | DELAYED_RELEASE_TABLET | Freq: Every day | ORAL | 1 refills | Status: DC

## 2022-10-05 MED ORDER — ALUM & MAG HYDROXIDE-SIMETH 200-200-20 MG/5ML PO SUSP
30.0000 mL | Freq: Once | ORAL | Status: AC
  Administered 2022-10-05: 30 mL via ORAL
  Filled 2022-10-05: qty 30

## 2022-10-05 NOTE — MAU Provider Note (Incomplete)
Chief Complaint:  Shortness of Breath and Abdominal Pain   Event Date/Time   First Provider Initiated Contact with Patient 10/05/22 2316     HPI: Ellianne Tager is a 22 y.o. G2P1001 at 58w0dwho presents to maternity admissions reporting discomforts with eating and breathing due to the "baby being breeched".  States it is just uncomfortable with the baby lying under her rib cage.  Has intermittent sharp stomach pain that she related to acid reflux.  States is on aspirin for that, but not on any acid reducers or antacids. She reports good fetal movement, denies LOF, vaginal bleeding, n/v, diarrhea, constipation or fever/chills.   Shortness of Breath This is a new problem. The current episode started 1 to 4 weeks ago. Associated symptoms include abdominal pain. Pertinent negatives include no chest pain, fever or headaches. Exacerbated by: baby moving. Associated symptoms comments: Does not have true dyspnea, just sensation of baby under diaphragm. The patient has no known risk factors for DVT/PE.  Abdominal Pain The pain is located in the epigastric region. The quality of the pain is described as burning. Pertinent negatives include no diarrhea, fever or headaches. Nothing relieves the symptoms. Past treatments include nothing.   RN Note: Cedric Moorehead is a 22 y.o. at [redacted]w[redacted]d here in MAU reporting having difficulty eating, sleeping, breathing and discomfort due to baby being breech at appt 2wks ago. Pt assumes baby is still breech due to continued discomfort. Has constant, stabbing pain in upper abdomen.Reports good FM and denies LOF or VB.   Onset of complaint: 1week Pain score: 7  Past Medical History: Past Medical History:  Diagnosis Date   Breast lump on left side at 4 o'clock position 06/15/2018   Medical history non-contributory    Pregnancy induced hypertension    Sickle cell trait (HCC) 07/16/2013   Supervision of normal first pregnancy, antepartum 10/27/2020           Nursing Staff  Provider  Office Location   [facility]  Dating    LMP  Language   English  Anatomy US    normal with FUP for growth  Flu Vaccine   03/12/21  Genetic/Carrier Screen   NIPS: LR Female    AFP:     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  TDaP Vaccine    03/12/21  Hgb A1C or   GTT  Early   Third trimester   Glucose, Fasting  70 - 91 mg/dL  85   Glucose, 1 hour  70 - 179 mg/dL  295   Gluc    Past obstetric history: OB History  Gravida Para Term Preterm AB Living  2 1 1     1   SAB IAB Ectopic Multiple Live Births        0 1    # Outcome Date GA Lbr Len/2nd Weight Sex Delivery Anes PTL Lv  2 Current           1 Term 05/20/21 [redacted]w[redacted]d 14:53 3445 g F Vag-Spont EPI  LIV    Past Surgical History: Past Surgical History:  Procedure Laterality Date   NO PAST SURGERIES      Family History: Family History  Problem Relation Age of Onset   Asthma Neg Hx    Eczema Neg Hx    Diabetes Neg Hx    Hypertension Neg Hx     Social History: Social History   Tobacco Use   Smoking status: Never    Passive exposure: Never   Smokeless tobacco: Never  Vaping Use   Vaping Use: Never used  Substance Use Topics   Alcohol use: Never    Alcohol/week: 0.0 standard drinks of alcohol   Drug use: Never    Allergies: No Known Allergies  Meds:  Medications Prior to Admission  Medication Sig Dispense Refill Last Dose   aspirin EC 81 MG tablet Take 1 tablet (81 mg total) by mouth daily. Swallow whole. 30 tablet 6 10/04/2022   Magnesium Oxide -Mg Supplement (MAG-OXIDE) 200 MG TABS Take 2 tablets (400 mg total) by mouth at bedtime. If that amount causes loose stools in the am, switch to 200mg  daily at bedtime. 60 tablet 3 10/05/2022 at 1300   Prenatal Vit-Fe Fumarate-FA (PRENATAL PLUS VITAMIN/MINERAL) 27-1 MG TABS Take 1 tablet by mouth daily. 30 tablet 11 10/05/2022   Blood Pressure Monitoring DEVI 1 each by Does not apply route once a week. 1 each 0    promethazine (PHENERGAN) 25 MG tablet Take 1 tablet (25 mg  total) by mouth every 6 (six) hours as needed for nausea or vomiting. (Patient not taking: Reported on 08/10/2022) 30 tablet 0     I have reviewed patient's Past Medical Hx, Surgical Hx, Family Hx, Social Hx, medications and allergies.   ROS:  Review of Systems  Constitutional:  Negative for fever.  Respiratory:  Positive for shortness of breath.   Cardiovascular:  Negative for chest pain.  Gastrointestinal:  Positive for abdominal pain. Negative for diarrhea.  Neurological:  Negative for headaches.   Other systems negative  Physical Exam  Patient Vitals for the past 24 hrs:  BP Temp Pulse Resp SpO2 Height Weight  10/05/22 2301 113/61 -- -- -- -- -- --  10/05/22 2257 -- (!) 97.4 F (36.3 C) 87 17 100 % 4\' 11"  (1.499 m) 84.4 kg   Constitutional: Well-developed, well-nourished female in no acute distress.  Cardiovascular: normal rate and rhythm Respiratory: normal effort, clear to auscultation bilaterally GI: Abd soft, non-tender, gravid appropriate for gestational age.   No rebound or guarding. MS: Extremities nontender, no edema, normal ROM Neurologic: Alert and oriented x 4.  GU: Neg CVAT.  FHT:  Baseline 140 , moderate variability, accelerations present, no decelerations Contractions: none   Labs: No results found for this or any previous visit (from the past 24 hour(s)).   Imaging:  Pt informed that the ultrasound is considered a limited OB ultrasound and is not intended to be a complete ultrasound exam.  Patient also informed that the ultrasound is not being completed with the intent of assessing for fetal or placental anomalies or any pelvic abnormalities.  Explained that the purpose of today's ultrasound is to assess for presentation.  Patient acknowledges the purpose of the exam and the limitations of the study.    Fetus is in the longitudinal lie Fetus is vertex in presentation  MAU Course/MDM: I have reviewed the triage vital signs and the nursing notes.    Pertinent labs & imaging results that were available during my care of the patient were reviewed by me and considered in my medical decision making (see chart for details).      I have reviewed her medical records including past results, notes and treatments.   NST reviewed  Treatments in MAU included Bedside US and Maalox for reflux  Assessment: Single IUP at [redacted]w[redacted]d Abdominal and chest pressure related to advancing fundal height Vertex presentation  Plan: Discharge home Preterm Labor precautions and fetal kick counts Rx Protonix for reflux Follow up in Office  for prenatal visits and recheck Encouraged to return if she develops worsening of symptoms, increase in pain, fever, or other concerning symptoms.   Pt stable at time of discharge.  Wynelle Bourgeois CNM, MSN Certified Nurse-Midwife 10/05/2022 11:16 PM

## 2022-10-05 NOTE — MAU Note (Addendum)
.  Judy Underwood is a 22 y.o. at [redacted]w[redacted]d here in MAU reporting having difficulty eating, sleeping, breathing and discomfort due to baby being breech at appt 2wks ago. Pt assumes baby is still breech due to continued discomfort. Has constant, stabbing pain in upper abdomen.Reports good FM and denies LOF or VB.   Onset of complaint: 1week Pain score: 7 Vitals:   10/05/22 2257 10/05/22 2301  BP:  113/61  Pulse: 87   Resp: 17   Temp: (!) 97.4 F (36.3 C)   SpO2: 100%      FHT:134 Lab orders placed from triage:  u/a

## 2022-10-06 DIAGNOSIS — K219 Gastro-esophageal reflux disease without esophagitis: Secondary | ICD-10-CM | POA: Diagnosis not present

## 2022-10-06 DIAGNOSIS — D563 Thalassemia minor: Secondary | ICD-10-CM | POA: Diagnosis not present

## 2022-10-06 DIAGNOSIS — Z3A31 31 weeks gestation of pregnancy: Secondary | ICD-10-CM | POA: Diagnosis not present

## 2022-10-06 DIAGNOSIS — R109 Unspecified abdominal pain: Secondary | ICD-10-CM

## 2022-10-13 ENCOUNTER — Other Ambulatory Visit: Payer: Self-pay | Admitting: *Deleted

## 2022-10-13 ENCOUNTER — Ambulatory Visit: Payer: Medicaid Other | Attending: Obstetrics

## 2022-10-13 ENCOUNTER — Ambulatory Visit: Payer: Medicaid Other | Admitting: *Deleted

## 2022-10-13 VITALS — BP 108/58 | HR 101

## 2022-10-13 DIAGNOSIS — O99212 Obesity complicating pregnancy, second trimester: Secondary | ICD-10-CM | POA: Diagnosis not present

## 2022-10-13 DIAGNOSIS — D5740 Sickle-cell thalassemia without crisis: Secondary | ICD-10-CM

## 2022-10-13 DIAGNOSIS — O09293 Supervision of pregnancy with other poor reproductive or obstetric history, third trimester: Secondary | ICD-10-CM | POA: Diagnosis not present

## 2022-10-13 DIAGNOSIS — O99013 Anemia complicating pregnancy, third trimester: Secondary | ICD-10-CM

## 2022-10-13 DIAGNOSIS — Z3A31 31 weeks gestation of pregnancy: Secondary | ICD-10-CM | POA: Diagnosis not present

## 2022-10-13 DIAGNOSIS — E669 Obesity, unspecified: Secondary | ICD-10-CM

## 2022-10-13 DIAGNOSIS — O09299 Supervision of pregnancy with other poor reproductive or obstetric history, unspecified trimester: Secondary | ICD-10-CM

## 2022-10-13 DIAGNOSIS — O99213 Obesity complicating pregnancy, third trimester: Secondary | ICD-10-CM

## 2022-10-20 ENCOUNTER — Encounter: Payer: Self-pay | Admitting: Family Medicine

## 2022-10-31 ENCOUNTER — Other Ambulatory Visit: Payer: Self-pay

## 2022-10-31 ENCOUNTER — Inpatient Hospital Stay (EMERGENCY_DEPARTMENT_HOSPITAL)
Admission: AD | Admit: 2022-10-31 | Discharge: 2022-11-01 | Disposition: A | Discharge status: Home / Self Care | Payer: Medicaid Other | Source: Home / Self Care | Attending: Obstetrics and Gynecology | Admitting: Obstetrics and Gynecology

## 2022-10-31 ENCOUNTER — Encounter (HOSPITAL_COMMUNITY): Payer: Self-pay | Admitting: Obstetrics and Gynecology

## 2022-10-31 DIAGNOSIS — B9689 Other specified bacterial agents as the cause of diseases classified elsewhere: Secondary | ICD-10-CM | POA: Insufficient documentation

## 2022-10-31 DIAGNOSIS — M545 Low back pain, unspecified: Secondary | ICD-10-CM | POA: Insufficient documentation

## 2022-10-31 DIAGNOSIS — O23593 Infection of other part of genital tract in pregnancy, third trimester: Secondary | ICD-10-CM | POA: Insufficient documentation

## 2022-10-31 DIAGNOSIS — O4700 False labor before 37 completed weeks of gestation, unspecified trimester: Secondary | ICD-10-CM | POA: Diagnosis not present

## 2022-10-31 DIAGNOSIS — O26893 Other specified pregnancy related conditions, third trimester: Secondary | ICD-10-CM | POA: Insufficient documentation

## 2022-10-31 DIAGNOSIS — Z3A34 34 weeks gestation of pregnancy: Secondary | ICD-10-CM

## 2022-10-31 DIAGNOSIS — Z3689 Encounter for other specified antenatal screening: Secondary | ICD-10-CM

## 2022-10-31 LAB — WET PREP, GENITAL
Sperm: NONE SEEN
Trich, Wet Prep: NONE SEEN
WBC, Wet Prep HPF POC: >=10 — AB (ref <10)
Yeast Wet Prep HPF POC: NONE SEEN

## 2022-10-31 LAB — URINALYSIS, ROUTINE W REFLEX MICROSCOPIC
Bilirubin Urine: NEGATIVE
Glucose, UA: NEGATIVE mg/dL
Ketones, ur: NEGATIVE mg/dL
Nitrite: NEGATIVE
Protein, ur: 30 mg/dL — AB
Specific Gravity, Urine: 1.016 (ref 1.005–1.030)
WBC, UA: >50 WBC/hpf (ref 0–5)
pH: 6 (ref 5.0–8.0)

## 2022-10-31 MED ORDER — ACETAMINOPHEN 500 MG PO TABS
1000.0000 mg | ORAL_TABLET | Freq: Once | ORAL | Status: AC
  Administered 2022-10-31: 1000 mg via ORAL
  Filled 2022-10-31: qty 2

## 2022-10-31 MED ORDER — CYCLOBENZAPRINE HCL 10 MG PO TABS: 10.0000 mg | ORAL_TABLET | Freq: Every evening | ORAL | 1 refills | Status: DC | PRN

## 2022-10-31 MED ORDER — LACTATED RINGERS IV BOLUS
1000.0000 mL | Freq: Once | INTRAVENOUS | Status: AC
  Administered 2022-10-31: 1000 mL via INTRAVENOUS

## 2022-10-31 MED ORDER — TERBUTALINE SULFATE 1 MG/ML IJ SOLN
0.2500 mg | Freq: Once | INTRAMUSCULAR | Status: AC
  Administered 2022-10-31: 0.25 mg via SUBCUTANEOUS
  Filled 2022-10-31: qty 1

## 2022-10-31 MED ORDER — METRONIDAZOLE 500 MG PO TABS: 500.0000 mg | ORAL_TABLET | Freq: Two times a day (BID) | ORAL | 0 refills | Status: DC

## 2022-10-31 MED ORDER — CYCLOBENZAPRINE HCL 5 MG PO TABS
10.0000 mg | ORAL_TABLET | Freq: Once | ORAL | Status: AC
  Administered 2022-10-31: 10 mg via ORAL
  Filled 2022-10-31: qty 2

## 2022-10-31 NOTE — MAU Provider Note (Cosign Needed)
History     CSN: [id number]  Arrival date and time: 10/31/22 7829   Event Date/Time   First Provider Initiated Contact with Patient 10/31/22 1949      Chief Complaint  Patient presents with   Contractions   Judy Underwood , a  22 y.o. G2P1001 at [redacted]w[redacted]d presents to MAU with complaints of contractions after slipping in her shower this evening around 5pm. Patient states that she slipped while stepping into the shower. She denies falling and denies abdominal trauma. She states that contractions are every 2-3 mins and last about 1 min. She states the contractions are "uncomfortable and kinda painful." She currently rates pain a 7/10 but denies attempting to relieve symptoms. Denies drinking any water today and reports only having chips and pop tarts. She denies vaginal bleeding, leaking of fluid, and endorses positive fetal movement.          OB History     Gravida  2   Para  1   Term  1   Preterm      AB      Living  1      SAB      IAB      Ectopic      Multiple  0   Live Births  1           Past Medical History:  Diagnosis Date   Breast lump on left side at 4 o'clock position 06/15/2018   Medical history non-contributory    Pregnancy induced hypertension    Sickle cell trait (HCC) 07/16/2013   Supervision of normal first pregnancy, antepartum 10/27/2020          Nursing Staff  Provider  Office Location   [facility]  Dating    LMP  Language   English  Anatomy US    normal with FUP for growth  Flu Vaccine   03/12/21  Genetic/Carrier Screen   NIPS: LR Female    AFP:     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  TDaP Vaccine    03/12/21  Hgb A1C or   GTT  Early   Third trimester   Glucose, Fasting  70 - 91 mg/dL  85   Glucose, 1 hour  70 - 179 mg/dL  562   Gluc    Past Surgical History:  Procedure Laterality Date   NO PAST SURGERIES      Family History  Problem Relation Age of Onset   Asthma Neg Hx    Eczema Neg Hx    Diabetes Neg Hx    Hypertension  Neg Hx     Social History   Tobacco Use   Smoking status: Never    Passive exposure: Never   Smokeless tobacco: Never  Vaping Use   Vaping status: Never Used  Substance Use Topics   Alcohol use: Never    Alcohol/week: 0.0 standard drinks of alcohol   Drug use: Yes    Types: Marijuana    Comment: last time 2 weeks ago    Allergies: No Known Allergies  Medications Prior to Admission  Medication Sig Dispense Refill Last Dose   aspirin EC 81 MG tablet Take 1 tablet (81 mg total) by mouth daily. Swallow whole. 30 tablet 6 10/30/2022   Magnesium Oxide -Mg Supplement (MAG-OXIDE) 200 MG TABS Take 2 tablets (400 mg total) by mouth at bedtime. If that amount causes loose stools in the am, switch to 200mg  daily at bedtime. 60 tablet 3 10/31/2022 at 0930  Prenatal Vit-Fe Fumarate-FA (PRENATAL PLUS VITAMIN/MINERAL) 27-1 MG TABS Take 1 tablet by mouth daily. 30 tablet 11 10/31/2022 at 0930   Blood Pressure Monitoring DEVI 1 each by Does not apply route once a week. 1 each 0    pantoprazole (PROTONIX) 20 MG tablet Take 1 tablet (20 mg total) by mouth daily. 30 tablet 1    promethazine (PHENERGAN) 25 MG tablet Take 1 tablet (25 mg total) by mouth every 6 (six) hours as needed for nausea or vomiting. (Patient not taking: Reported on 08/10/2022) 30 tablet 0     Review of Systems  Constitutional:  Negative for chills, fatigue and fever.  Eyes:  Negative for pain and visual disturbance.  Respiratory:  Negative for apnea, shortness of breath and wheezing.   Cardiovascular:  Negative for chest pain and palpitations.  Gastrointestinal:  Positive for abdominal pain. Negative for constipation, diarrhea, nausea and vomiting.  Genitourinary:  Positive for pelvic pain and vaginal pain. Negative for difficulty urinating, dysuria, vaginal bleeding and vaginal discharge.  Musculoskeletal:  Negative for back pain.  Neurological:  Negative for seizures, weakness and headaches.  Psychiatric/Behavioral:  Negative for  suicidal ideas.    Physical Exam   Blood pressure 117/65, pulse 98, temperature 97.8 F (36.6 C), temperature source Oral, resp. rate 12, weight 85.2 kg, last menstrual period 03/02/2022, SpO2 98%, unknown if currently breastfeeding.  Physical Exam Vitals and nursing note reviewed.  Constitutional:      General: She is not in acute distress.    Appearance: Normal appearance.  HENT:     Head: Normocephalic.  Cardiovascular:     Rate and Rhythm: Normal rate.  Pulmonary:     Effort: Pulmonary effort is normal.  Abdominal:     Palpations: Abdomen is soft.     Tenderness: There is abdominal tenderness. There is no right CVA tenderness or left CVA tenderness.     Comments: Mild to moderate contractions on palpation.   Genitourinary:    General: Normal vulva.     Comments: Dilation: 3 Effacement (%): 60 Station: -1 Exam by:: Dorathy Daft, CNM  Musculoskeletal:        General: Normal range of motion.     Cervical back: Normal range of motion.  Skin:    General: Skin is warm and dry.  Neurological:     Mental Status: She is alert and oriented to person, place, and time.  Psychiatric:        Mood and Affect: Mood normal.    FHT: 135 bpm with moderate variability. Accels present no decels noted. (Appropriate for gestational age)  Toco: Contractions every 2-4 mins.   MAU Course  Procedures Orders Placed This Encounter  Procedures   Culture, OB Urine   Wet prep, genital   Urinalysis, Routine w reflex microscopic -Urine, Clean Catch   Insert peripheral IV   Meds ordered this encounter  Medications   lactated ringers bolus 1,000 mL   acetaminophen (TYLENOL) tablet 1,000 mg   Results for orders placed or performed during the hospital encounter of 10/31/22 (from the past 24 hour(s))  Urinalysis, Routine w reflex microscopic -Urine, Clean Catch     Status: Abnormal   Collection Time: 10/31/22  6:40 PM  Result Value Ref Range   Color, Urine AMBER (A) YELLOW   APPearance  CLOUDY (A) CLEAR   Specific Gravity, Urine 1.016 1.005 - 1.030   pH 6.0 5.0 - 8.0   Glucose, UA NEGATIVE NEGATIVE mg/dL   Hgb urine dipstick MODERATE (A)  NEGATIVE   Bilirubin Urine NEGATIVE NEGATIVE   Ketones, ur NEGATIVE NEGATIVE mg/dL   Protein, ur 30 (A) NEGATIVE mg/dL   Nitrite NEGATIVE NEGATIVE   Leukocytes,Ua LARGE (A) NEGATIVE   RBC / HPF 11-20 0 - 5 RBC/hpf   WBC, UA >50 0 - 5 WBC/hpf   Bacteria, UA RARE (A) NONE SEEN   Squamous Epithelial / HPF 21-50 0 - 5 /HPF   Mucus PRESENT      MDM - Cervix 3 cm,  - Mild Dehydration noted on UA, IV fluids and PO tylenol for Pain.  - Wet prep and GC collected.  - Transfer of Care to Tyler Aas, CNM @ [address]) Suzie Portela, MSN, CNM  10/31/2022 9:12 PM    Assessment and Plan

## 2022-10-31 NOTE — MAU Note (Signed)
.  Judy Underwood is a 22 y.o. at [redacted]w[redacted]d here in MAU reporting: ctx after she slipped, but did not fall reports mucus plug fell out last night.   Onset of complaint: 1700 Pain score: 7/10 There were no vitals filed for this visit.   FHT: 140 Lab orders placed from triage:  UA

## 2022-11-02 ENCOUNTER — Encounter (HOSPITAL_COMMUNITY): Payer: Self-pay | Admitting: Obstetrics and Gynecology

## 2022-11-02 ENCOUNTER — Other Ambulatory Visit: Payer: Self-pay

## 2022-11-02 ENCOUNTER — Inpatient Hospital Stay (HOSPITAL_COMMUNITY)
Admission: AD | Admit: 2022-11-02 | Discharge: 2022-11-05 | DRG: 805 | Disposition: A | Discharge status: Home / Self Care | Payer: Medicaid Other | Attending: Obstetrics and Gynecology | Admitting: Obstetrics and Gynecology

## 2022-11-02 DIAGNOSIS — O9902 Anemia complicating childbirth: Secondary | ICD-10-CM | POA: Diagnosis not present

## 2022-11-02 DIAGNOSIS — Z5982 Transportation insecurity: Secondary | ICD-10-CM | POA: Diagnosis not present

## 2022-11-02 DIAGNOSIS — Z148 Genetic carrier of other disease: Secondary | ICD-10-CM

## 2022-11-02 DIAGNOSIS — O4290 Premature rupture of membranes, unspecified as to length of time between rupture and onset of labor, unspecified weeks of gestation: Secondary | ICD-10-CM | POA: Diagnosis not present

## 2022-11-02 DIAGNOSIS — D573 Sickle-cell trait: Secondary | ICD-10-CM | POA: Diagnosis present

## 2022-11-02 DIAGNOSIS — O42013 Preterm premature rupture of membranes, onset of labor within 24 hours of rupture, third trimester: Secondary | ICD-10-CM | POA: Diagnosis not present

## 2022-11-02 DIAGNOSIS — O42913 Preterm premature rupture of membranes, unspecified as to length of time between rupture and onset of labor, third trimester: Secondary | ICD-10-CM | POA: Diagnosis not present

## 2022-11-02 DIAGNOSIS — Z3A35 35 weeks gestation of pregnancy: Secondary | ICD-10-CM

## 2022-11-02 DIAGNOSIS — O2690 Pregnancy related conditions, unspecified, unspecified trimester: Secondary | ICD-10-CM | POA: Diagnosis not present

## 2022-11-02 DIAGNOSIS — Z7982 Long term (current) use of aspirin: Secondary | ICD-10-CM

## 2022-11-02 DIAGNOSIS — Z3490 Encounter for supervision of normal pregnancy, unspecified, unspecified trimester: Secondary | ICD-10-CM

## 2022-11-02 DIAGNOSIS — Z8759 Personal history of other complications of pregnancy, childbirth and the puerperium: Secondary | ICD-10-CM

## 2022-11-02 LAB — CBC
HCT: 37.3 % (ref 36.0–46.0)
Hemoglobin: 11.9 g/dL — ABNORMAL LOW (ref 12.0–15.0)
MCH: 27.4 pg (ref 26.0–34.0)
MCHC: 31.9 g/dL (ref 30.0–36.0)
MCV: 85.9 fL (ref 80.0–100.0)
Platelets: 257 10*3/uL (ref 150–400)
RBC: 4.34 MIL/uL (ref 3.87–5.11)
RDW: 13.7 % (ref 11.5–15.5)
WBC: 8.9 10*3/uL (ref 4.0–10.5)
nRBC: 0 % (ref 0.0–0.2)

## 2022-11-02 LAB — TYPE AND SCREEN
ABO/RH(D): O POS
Antibody Screen: NEGATIVE

## 2022-11-02 LAB — WET PREP, GENITAL
Clue Cells Wet Prep HPF POC: NONE SEEN
Sperm: NONE SEEN
Trich, Wet Prep: NONE SEEN
WBC, Wet Prep HPF POC: >=10 — AB (ref <10)
Yeast Wet Prep HPF POC: NONE SEEN

## 2022-11-02 LAB — RUPTURE OF MEMBRANE (ROM)PLUS: Rom Plus: POSITIVE

## 2022-11-02 MED ORDER — SODIUM CHLORIDE 0.9 % IV SOLN
5.0000 10*6.[IU] | Freq: Once | INTRAVENOUS | Status: AC
  Administered 2022-11-02: 5 10*6.[IU] via INTRAVENOUS
  Filled 2022-11-02: qty 5

## 2022-11-02 MED ORDER — ONDANSETRON HCL 4 MG/2ML IJ SOLN: 4.0000 mg | Freq: Four times a day (QID) | INTRAMUSCULAR | Status: DC | PRN

## 2022-11-02 MED ORDER — PENICILLIN G POT IN DEXTROSE 60000 UNIT/ML IV SOLN
3.0000 10*6.[IU] | INTRAVENOUS | Status: DC
  Administered 2022-11-03 (×4): 3 10*6.[IU] via INTRAVENOUS
  Filled 2022-11-02 (×2): qty 50

## 2022-11-02 MED ORDER — FENTANYL CITRATE (PF) 100 MCG/2ML IJ SOLN: 100.0000 ug | INTRAMUSCULAR | Status: DC | PRN

## 2022-11-02 MED ORDER — LIDOCAINE HCL (PF) 1 % IJ SOLN: 30.0000 mL | INTRAMUSCULAR | Status: DC | PRN

## 2022-11-02 MED ORDER — OXYTOCIN-SODIUM CHLORIDE 30-0.9 UT/500ML-% IV SOLN
2.5000 [IU]/h | INTRAVENOUS | Status: DC
  Filled 2022-11-02: qty 500

## 2022-11-02 MED ORDER — LACTATED RINGERS IV SOLN: INTRAVENOUS | Status: DC

## 2022-11-02 MED ORDER — ACETAMINOPHEN 325 MG PO TABS: 650.0000 mg | ORAL_TABLET | ORAL | Status: DC | PRN

## 2022-11-02 MED ORDER — OXYCODONE-ACETAMINOPHEN 5-325 MG PO TABS: 2.0000 | ORAL_TABLET | ORAL | Status: DC | PRN

## 2022-11-02 MED ORDER — SOD CITRATE-CITRIC ACID 500-334 MG/5ML PO SOLN: 30.0000 mL | ORAL | Status: DC | PRN

## 2022-11-02 MED ORDER — OXYTOCIN BOLUS FROM INFUSION
333.0000 mL | Freq: Once | INTRAVENOUS | Status: AC
  Administered 2022-11-03: 333 mL via INTRAVENOUS

## 2022-11-02 MED ORDER — LACTATED RINGERS IV SOLN: 500.0000 mL | INTRAVENOUS | Status: DC | PRN

## 2022-11-02 MED ORDER — FLEET ENEMA 7-19 GM/118ML RE ENEM: 1.0000 | ENEMA | RECTAL | Status: DC | PRN

## 2022-11-02 MED ORDER — HYDROXYZINE HCL 50 MG PO TABS: 50.0000 mg | ORAL_TABLET | Freq: Four times a day (QID) | ORAL | Status: DC | PRN

## 2022-11-02 MED ORDER — OXYCODONE-ACETAMINOPHEN 5-325 MG PO TABS: 1.0000 | ORAL_TABLET | ORAL | Status: DC | PRN

## 2022-11-02 NOTE — H&P (Signed)
LABOR H&P Judy Underwood is a 22 y.o. G2P1001 female at [redacted]w[redacted]d by LMP c/w U/S at 27 wks presenting for PPROM @ 1800 clear fluid.   Reports active fetal movement, contractions: none, vaginal bleeding: none, membranes: ruptured, clear fluid.  Initiated prenatal care at [facility] at 12 wks.   Most recent u/s 7/17 @ 31.6w, EFW 48%, vtx.   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  Prenatal History/Complications:  H/O term SVB after IOL for GHTN, pp severe pre-e  Past Medical History: Past Medical History:  Diagnosis Date   Breast lump on left side at 4 o'clock position 06/15/2018   Medical history non-contributory    Pregnancy induced hypertension    Sickle cell trait (HCC) 07/16/2013   Supervision of normal first pregnancy, antepartum 10/27/2020          Nursing Staff  Provider  Office Location   [facility]  Dating    LMP  Language   English  Anatomy US    normal with FUP for growth  Flu Vaccine   03/12/21  Genetic/Carrier Screen   NIPS: LR Female    AFP:     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  TDaP Vaccine    03/12/21  Hgb A1C or   GTT  Early   Third trimester   Glucose, Fasting  70 - 91 mg/dL  85   Glucose, 1 hour  70 - 179 mg/dL  086   Gluc    Past Surgical History: Past Surgical History:  Procedure Laterality Date   NO PAST SURGERIES      Obstetrical History: OB History  Gravida Para Term Preterm AB Living  2 1 1     1   SAB IAB Ectopic Multiple Live Births        0 1    # Outcome Date GA Lbr Len/2nd Weight Sex Type Anes PTL Lv  2 Current           1 Term 05/20/21 [redacted]w[redacted]d 14:53 3445 g F Vag-Spont EPI  LIV    Social History: Social History   Socioeconomic History   Marital status: Single    Spouse name: Not on file   Number of children: Not on file   Years of education: Not on file   Highest education level: High school graduate  Occupational History   Occupation: employed  Tobacco Use   Smoking status: Never    Passive  exposure: Never   Smokeless tobacco: Never  Vaping Use   Vaping status: Never Used  Substance and Sexual Activity   Alcohol use: Never    Alcohol/week: 0.0 standard drinks of alcohol   Drug use: Yes    Types: Marijuana    Comment: last time 2 weeks ago   Sexual activity: Yes    Birth control/protection: None    Comment: Nexplanon removed 02/2020  Other Topics Concern   Not on file  Social History Narrative   Not on file   Social Determinants of Health   Financial Resource Strain: Not on file  Food Insecurity: No Food Insecurity (09/08/2022)   Hunger Vital Sign    Worried About Running Out of Food in the Last Year: Never true    Ran Out of Food in the Last Year: Never true  Transportation Needs: Unmet Transportation Needs (09/08/2022)   PRAPARE - Administrator, Civil Service (Medical): Yes    Lack of Transportation (Non-Medical): Yes  Physical Activity: Not on file  Stress: Not on  file  Social Connections: Not on file    Allergies: No Known Allergies  Medications Prior to Admission  Medication Sig Dispense Refill Last Dose   aspirin EC 81 MG tablet Take 1 tablet (81 mg total) by mouth daily. Swallow whole. 30 tablet 6 11/02/2022   cyclobenzaprine (FLEXERIL) 10 MG tablet Take 1 tablet (10 mg total) by mouth at bedtime as needed for muscle spasms. 30 tablet 1 11/02/2022   metroNIDAZOLE (FLAGYL) 500 MG tablet Take 1 tablet (500 mg total) by mouth 2 (two) times daily. 14 tablet 0 11/02/2022   Prenatal Vit-Fe Fumarate-FA (PRENATAL PLUS VITAMIN/MINERAL) 27-1 MG TABS Take 1 tablet by mouth daily. 30 tablet 11 11/02/2022   Blood Pressure Monitoring DEVI 1 each by Does not apply route once a week. 1 each 0    Magnesium Oxide -Mg Supplement (MAG-OXIDE) 200 MG TABS Take 2 tablets (400 mg total) by mouth at bedtime. If that amount causes loose stools in the am, switch to 200mg  daily at bedtime. 60 tablet 3    pantoprazole (PROTONIX) 20 MG tablet Take 1 tablet (20 mg total) by mouth  daily. 30 tablet 1     Review of Systems  Pertinent pos/neg as indicated in HPI  Blood pressure 121/66, pulse 91, temperature 97.7 F (36.5 C), temperature source Oral, resp. rate 15, height 4\' 11"  (1.499 m), weight 83.9 kg, last menstrual period 03/02/2022, SpO2 99%, unknown if currently breastfeeding. General appearance: alert, cooperative, and no distress Lungs: clear to auscultation bilaterally Heart: regular rate and rhythm Abdomen: gravid, soft, non-tender Extremities: tr edema  Fetal monitoring: FHR: 145 bpm, variability: moderate,  Accelerations: Present,  decelerations:  Absent Uterine activity: uterine irritability SVE: Dilation: 2.5 Effacement (%): 60 Station: -2 Exam by:: Wynelle Bourgeois, CNM Presentation: cephalic   Prenatal labs: ABO, Rh: O/Positive/-- (03/04 1458) Antibody: Negative (03/04 1458) Rubella: 8.26 (03/04 1458) RPR: Non Reactive (06/12 0818)  HBsAg: Negative (03/04 1458)  HIV: Non Reactive (06/12 0818)  HepC: Non Reactive (03/04 1458) GBS:   pending 2hr GTT: normal  Results for orders placed or performed during the hospital encounter of 11/02/22 (from the past 24 hour(s))  Rupture of Membrane (ROM) Plus   Collection Time: 11/02/22  8:57 PM  Result Value Ref Range   Rom Plus POSITIVE      Assessment:  [redacted]w[redacted]d SIUP  G2P1001  PPROM  Cat 1 FHR  GBS  unknown  H/O GHTN and severe pp pree  Plan:  Admit to L&D  IV pain meds/epidural prn active labor  Expectant management, pitocin if needed  Planned mode of delivery: NSVB   Planned infant feeding: bottlefeeding  Planned contraception: Nexplanon  plans inpatient     Circumcision: n/a  Cheral Marker CNM, WHNP-BC 11/02/2022, 9:51 PM

## 2022-11-02 NOTE — Progress Notes (Signed)
Pt in labor, appt canceled.

## 2022-11-02 NOTE — MAU Provider Note (Signed)
Chief Complaint:  Rupture of Membranes   Event Date/Time   First Provider Initiated Contact with Patient 11/02/22 2040     HPI: Vidalia Oflynn is a 22 y.o. G2P1001 at 45w0dwho presents to maternity admissions reporting feeling a pop and then wetness.  Has some contractions but they are not painful . She reports good fetal movement, denies vaginal bleeding, vaginal itching/burning, urinary symptoms, h/a, dizziness, n/v, diarrhea, constipation or fever/chills.   Vaginal Discharge The patient's primary symptoms include vaginal discharge. The patient's pertinent negatives include no genital odor, pelvic pain or vaginal bleeding. This is a new problem. The current episode started today. The patient is experiencing no pain. She is pregnant. Pertinent negatives include no abdominal pain, back pain, fever, nausea or vomiting. The vaginal discharge was clear and watery. There has been no bleeding. She has not been passing clots. She has not been passing tissue. Nothing aggravates the symptoms. She has tried nothing for the symptoms.   RN Note: Bertilla Shattles is a 22 y.o. at [redacted]w[redacted]d here in MAU reporting: felt a pop and then had a gush of clear fluid. Denies pain. +FM.  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  TDaP Vaccine    03/12/21  Hgb A1C or   GTT  Early   Third trimester   Glucose, Fasting  70 - 91 mg/dL  85   Glucose, 1 hour  70 - 179 mg/dL  829   Gluc    Past obstetric  history: OB History  Gravida Para Term Preterm AB Living  2 1 1     1   SAB IAB Ectopic Multiple Live Births        0 1    # Outcome Date GA Lbr Len/2nd Weight Sex Type Anes PTL Lv  2 Current           1 Term 05/20/21 [redacted]w[redacted]d 14:53 3445 g F Vag-Spont EPI  LIV    Past Surgical History: Past Surgical History:  Procedure Laterality Date   NO PAST SURGERIES      Family History: Family History  Problem Relation Age of Onset   Asthma Neg Hx    Eczema Neg Hx    Diabetes Neg Hx    Hypertension Neg Hx     Social History: Social History   Tobacco Use   Smoking status: Never    Passive exposure: Never   Smokeless tobacco: Never  Vaping Use   Vaping status: Never Used  Substance Use Topics   Alcohol use: Never    Alcohol/week: 0.0 standard drinks of alcohol   Drug use: Yes    Types: Marijuana    Comment: last time 2 weeks ago    Allergies: No Known Allergies  Meds:  Medications Prior to Admission  Medication Sig Dispense Refill Last Dose   aspirin EC  81 MG tablet Take 1 tablet (81 mg total) by mouth daily. Swallow whole. 30 tablet 6 11/02/2022   cyclobenzaprine (FLEXERIL) 10 MG tablet Take 1 tablet (10 mg total) by mouth at bedtime as needed for muscle spasms. 30 tablet 1 11/02/2022   metroNIDAZOLE (FLAGYL) 500 MG tablet Take 1 tablet (500 mg total) by mouth 2 (two) times daily. 14 tablet 0 11/02/2022   Prenatal Vit-Fe Fumarate-FA (PRENATAL PLUS VITAMIN/MINERAL) 27-1 MG TABS Take 1 tablet by mouth daily. 30 tablet 11 11/02/2022   Blood Pressure Monitoring DEVI 1 each by Does not apply route once a week. 1 each 0    Magnesium Oxide -Mg Supplement (MAG-OXIDE) 200 MG TABS Take 2 tablets (400 mg total) by mouth at bedtime. If that amount causes loose stools in the am, switch to 200mg  daily at bedtime. 60 tablet 3    pantoprazole (PROTONIX) 20 MG tablet Take 1 tablet (20 mg total) by mouth daily. 30 tablet 1     I have reviewed patient's Past Medical Hx, Surgical Hx, Family Hx, Social  Hx, medications and allergies.   ROS:  Review of Systems  Constitutional:  Negative for fever.  Gastrointestinal:  Negative for abdominal pain, nausea and vomiting.  Genitourinary:  Positive for vaginal discharge. Negative for pelvic pain.  Musculoskeletal:  Negative for back pain.   Other systems negative  Physical Exam  Patient Vitals for the past 24 hrs:  BP Temp Temp src Pulse Resp SpO2 Height Weight  11/02/22 2033 125/74 97.7 F (36.5 C) Oral 98 15 98 % 4\' 11"  (1.499 m) 83.9 kg   Constitutional: Well-developed, well-nourished female in no acute distress.  Cardiovascular: normal rate  Respiratory: normal effort GI: Abd soft, non-tender, gravid appropriate for gestational age.   No rebound or guarding. MS: Extremities nontender, no edema, normal ROM Neurologic: Alert and oriented x 4.  GU: Neg CVAT.  PELVIC EXAM: Perineum wet, no pooling in vagina, scant mucous discharge, vaginal walls and external genitalia normal  FHT:  Baseline 140 , moderate variability, accelerations present, no decelerations Contractions:  Irregular     Labs: Results for orders placed or performed during the hospital encounter of 11/02/22 (from the past 24 hour(s))  Rupture of Membrane (ROM) Plus     Status: None   Collection Time: 11/02/22  8:57 PM  Result Value Ref Range   Rom Plus POSITIVE     O/Positive/-- (03/04 1458)  Imaging:    MAU Course/MDM: I have reviewed the triage vital signs and the nursing notes.   Pertinent labs & imaging results that were available during my care of the patient were reviewed by me and considered in my medical decision making (see chart for details).      I have reviewed her medical records including past results, notes and treatments.   I have ordered labs and reviewed results.  NST reviewed  Treatments in MAU included EFM, SSE.    Assessment: Single IUP at [redacted]w[redacted]d Preterm Premature Rupture of Membranes  Plan: Admit  Routine orders Labor team to  follow   Wynelle Bourgeois CNM, MSN Certified Nurse-Midwife 11/02/2022 8:40 PM

## 2022-11-02 NOTE — MAU Note (Signed)
..  Judy Underwood is a 22 y.o. at [redacted]w[redacted]d here in MAU reporting: felt a pop and then had a gush of clear fluid. Denies pain. +FM.   Pain score: 0/10 Vitals:   11/02/22 2033  BP: 125/74  Pulse: 98  Resp: 15  Temp: 97.7 F (36.5 C)  SpO2: 98%     TDD:UKGURKY in room 140's Lab orders placed from triage:  fern

## 2022-11-03 ENCOUNTER — Ambulatory Visit (INDEPENDENT_AMBULATORY_CARE_PROVIDER_SITE_OTHER): Payer: Self-pay | Admitting: Certified Nurse Midwife

## 2022-11-03 ENCOUNTER — Inpatient Hospital Stay (HOSPITAL_COMMUNITY): Payer: Medicaid Other | Admitting: Anesthesiology

## 2022-11-03 ENCOUNTER — Encounter (HOSPITAL_COMMUNITY): Payer: Self-pay | Admitting: Obstetrics and Gynecology

## 2022-11-03 DIAGNOSIS — Z3A35 35 weeks gestation of pregnancy: Secondary | ICD-10-CM | POA: Diagnosis not present

## 2022-11-03 DIAGNOSIS — Z8759 Personal history of other complications of pregnancy, childbirth and the puerperium: Secondary | ICD-10-CM

## 2022-11-03 DIAGNOSIS — O4290 Premature rupture of membranes, unspecified as to length of time between rupture and onset of labor, unspecified weeks of gestation: Secondary | ICD-10-CM | POA: Diagnosis not present

## 2022-11-03 DIAGNOSIS — O42013 Preterm premature rupture of membranes, onset of labor within 24 hours of rupture, third trimester: Secondary | ICD-10-CM | POA: Diagnosis not present

## 2022-11-03 DIAGNOSIS — Z3493 Encounter for supervision of normal pregnancy, unspecified, third trimester: Secondary | ICD-10-CM

## 2022-11-03 DIAGNOSIS — O4700 False labor before 37 completed weeks of gestation, unspecified trimester: Secondary | ICD-10-CM

## 2022-11-03 MED ORDER — DIPHENHYDRAMINE HCL 50 MG/ML IJ SOLN: 12.5000 mg | INTRAMUSCULAR | Status: DC | PRN

## 2022-11-03 MED ORDER — EPHEDRINE 5 MG/ML INJ: 10.0000 mg | INTRAVENOUS | Status: DC | PRN

## 2022-11-03 MED ORDER — DIPHENHYDRAMINE HCL 25 MG PO CAPS: 25.0000 mg | ORAL_CAPSULE | Freq: Four times a day (QID) | ORAL | Status: DC | PRN

## 2022-11-03 MED ORDER — PHENYLEPHRINE 80 MCG/ML (10ML) SYRINGE FOR IV PUSH (FOR BLOOD PRESSURE SUPPORT): 80.0000 ug | PREFILLED_SYRINGE | INTRAVENOUS | Status: DC | PRN

## 2022-11-03 MED ORDER — LIDOCAINE HCL (PF) 1 % IJ SOLN
INTRAMUSCULAR | Status: DC | PRN
  Administered 2022-11-03: 8 mL via EPIDURAL

## 2022-11-03 MED ORDER — FENTANYL-BUPIVACAINE-NACL 0.5-0.125-0.9 MG/250ML-% EP SOLN: 12.0000 mL/h | EPIDURAL | Status: DC | PRN

## 2022-11-03 MED ORDER — LACTATED RINGERS IV SOLN: 500.0000 mL | Freq: Once | INTRAVENOUS | Status: DC

## 2022-11-03 MED ORDER — WITCH HAZEL-GLYCERIN EX PADS: 1.0000 | MEDICATED_PAD | CUTANEOUS | Status: DC | PRN

## 2022-11-03 MED ORDER — BENZOCAINE-MENTHOL 20-0.5 % EX AERO: 1.0000 | INHALATION_SPRAY | CUTANEOUS | Status: DC | PRN

## 2022-11-03 MED ORDER — FENTANYL-BUPIVACAINE-NACL 0.5-0.125-0.9 MG/250ML-% EP SOLN
12.0000 mL/h | EPIDURAL | Status: DC | PRN
  Administered 2022-11-03: 12 mL/h via EPIDURAL
  Filled 2022-11-03: qty 250

## 2022-11-03 MED ORDER — OXYCODONE HCL 5 MG PO TABS: 5.0000 mg | ORAL_TABLET | ORAL | Status: DC | PRN

## 2022-11-03 MED ORDER — TERBUTALINE SULFATE 1 MG/ML IJ SOLN: 0.2500 mg | Freq: Once | INTRAMUSCULAR | Status: DC | PRN

## 2022-11-03 MED ORDER — OXYTOCIN-SODIUM CHLORIDE 30-0.9 UT/500ML-% IV SOLN
1.0000 m[IU]/min | INTRAVENOUS | Status: DC
  Administered 2022-11-03: 2 m[IU]/min via INTRAVENOUS
  Filled 2022-11-03: qty 500

## 2022-11-03 MED ORDER — SENNOSIDES-DOCUSATE SODIUM 8.6-50 MG PO TABS
2.0000 | ORAL_TABLET | Freq: Every day | ORAL | Status: DC
  Administered 2022-11-04 – 2022-11-05 (×2): 2 via ORAL
  Filled 2022-11-03 (×2): qty 2

## 2022-11-03 MED ORDER — SIMETHICONE 80 MG PO CHEW: 80.0000 mg | CHEWABLE_TABLET | ORAL | Status: DC | PRN

## 2022-11-03 MED ORDER — ZOLPIDEM TARTRATE 5 MG PO TABS: 5.0000 mg | ORAL_TABLET | Freq: Every evening | ORAL | Status: DC | PRN

## 2022-11-03 MED ORDER — ONDANSETRON HCL 4 MG/2ML IJ SOLN: 4.0000 mg | INTRAMUSCULAR | Status: DC | PRN

## 2022-11-03 MED ORDER — DIBUCAINE (PERIANAL) 1 % EX OINT: 1.0000 | TOPICAL_OINTMENT | CUTANEOUS | Status: DC | PRN

## 2022-11-03 MED ORDER — ACETAMINOPHEN 325 MG PO TABS: 650.0000 mg | ORAL_TABLET | ORAL | Status: DC | PRN

## 2022-11-03 MED ORDER — PHENYLEPHRINE 80 MCG/ML (10ML) SYRINGE FOR IV PUSH (FOR BLOOD PRESSURE SUPPORT)
80.0000 ug | PREFILLED_SYRINGE | INTRAVENOUS | Status: DC | PRN
  Filled 2022-11-03: qty 10

## 2022-11-03 MED ORDER — ONDANSETRON HCL 4 MG PO TABS: 4.0000 mg | ORAL_TABLET | ORAL | Status: DC | PRN

## 2022-11-03 MED ORDER — IBUPROFEN 600 MG PO TABS
600.0000 mg | ORAL_TABLET | Freq: Four times a day (QID) | ORAL | Status: DC
  Administered 2022-11-03 – 2022-11-05 (×7): 600 mg via ORAL
  Filled 2022-11-03 (×7): qty 1

## 2022-11-03 MED ORDER — COCONUT OIL OIL: 1.0000 | TOPICAL_OIL | Status: DC | PRN

## 2022-11-03 MED ORDER — PRENATAL MULTIVITAMIN CH
1.0000 | ORAL_TABLET | Freq: Every day | ORAL | Status: DC
  Administered 2022-11-04 – 2022-11-05 (×2): 1 via ORAL
  Filled 2022-11-03 (×2): qty 1

## 2022-11-03 NOTE — Anesthesia Procedure Notes (Addendum)
Epidural Patient location during procedure: OB Start time: 11/03/2022 3:38 AM End time: 11/03/2022 3:42 AM  Staffing Anesthesiologist: Bethena Midget, MD Performed: other anesthesia staff   Preanesthetic Checklist Completed: patient identified, IV checked, site marked, risks and benefits discussed, surgical consent, monitors and equipment checked, pre-op evaluation and timeout performed  Epidural Patient position: sitting Prep: DuraPrep and site prepped and draped Patient monitoring: continuous pulse ox and blood pressure Approach: midline Location: L3-L4 Injection technique: LOR air  Needle:  Needle type: Tuohy  Needle gauge: 17 G Needle length: 9 cm and 9 Needle insertion depth: 6 cm Catheter type: closed end flexible Catheter size: 19 Gauge Catheter at skin depth: 12 cm Test dose: negative  Assessment Events: blood not aspirated, no cerebrospinal fluid, injection not painful, no injection resistance, no paresthesia and negative IV test

## 2022-11-03 NOTE — Discharge Summary (Signed)
Postpartum Discharge Summary  Date of Service updated     Patient Name: Judy Underwood DOB: 07/11/2000 MRN: [id number]  Date of admission: 11/02/2022 Delivery date:11/03/2022 Delivering provider: Alfredia Ferguson Date of discharge: 11/05/2022  Admitting diagnosis: Leakage, amniotic fluid [O42.90] Intrauterine pregnancy: [redacted]w[redacted]d     Secondary diagnosis:  Principal Problem:   SVD (spontaneous vaginal delivery) Active Problems:   Supervision of low-risk pregnancy   History of pre-eclampsia   Leakage, amniotic fluid  Additional problems: None    Discharge diagnosis:  PPROM                                               Post partum procedures: None Augmentation: Pitocin Complications: None  Hospital course: Onset of Labor With Vaginal Delivery      22 y.o. yo [id number] at [redacted]w[redacted]d was admitted in Latent Labor following PPROM on 11/02/2022. Labor course was complicated by Membrane Rupture Time/Date: 6:00 PM,11/02/2022  Delivery Method:Vaginal, Spontaneous Operative Delivery:N/A Episiotomy: None Lacerations:  None Patient had a postpartum course that was uncomplicated.  She is ambulating, tolerating a regular diet, passing flatus, and urinating well. She had a nexplanon placed prior to discharge. Please see the procedure note for details. Patient is discharged home in stable condition on 11/05/22.  Newborn Data: Birth date:11/03/2022 Birth time:5:06 PM Gender:Female Living status:Living Apgars:9 ,9  Weight:2430 g  Magnesium Sulfate received: No BMZ received: No Rhophylac:N/A MMR:N/A T-DaP:Given prenatally Flu: N/A Transfusion:No  Physical exam  Vitals:   11/04/22 0905 11/04/22 1303 11/04/22 2219 11/05/22 0513  BP: 106/77 119/74 124/86 117/76  Pulse: 85 75  69  Resp: 20 18 16 16   Temp: 98.4 F (36.9 C) 98 F (36.7 C) 97.7 F (36.5 C) 97.9 F (36.6 C)  TempSrc: Oral Oral Oral Oral  SpO2:  97% 100% 100%  Weight:      Height:       General: alert, cooperative,  and no distress Lochia: appropriate Uterine Fundus: firm Incision: N/A DVT Evaluation: No evidence of DVT seen on physical exam. Labs: Lab Results  Component Value Date   WBC 8.9 11/02/2022   HGB 11.9 (L) 11/02/2022   HCT 37.3 11/02/2022   MCV 85.9 11/02/2022   PLT 257 11/02/2022      Latest Ref Rng & Units 05/31/2022    2:58 PM  CMP  Glucose 70 - 99 mg/dL 73   BUN 6 - 20 mg/dL 8   Creatinine 1.47 - 8.29 mg/dL 5.62   Sodium 130 - 865 mmol/L 138   Potassium 3.5 - 5.2 mmol/L 4.3   Chloride 96 - 106 mmol/L 101   CO2 20 - 29 mmol/L 16   Calcium 8.7 - 10.2 mg/dL 78.4   Total Protein 6.0 - 8.5 g/dL 8.1   Total Bilirubin 0.0 - 1.2 mg/dL 0.2   Alkaline Phos 44 - 121 IU/L 82   AST 0 - 40 IU/L 12   ALT 0 - 32 IU/L 9    Edinburgh Score:    11/03/2022    9:07 PM  Edinburgh Postnatal Depression Scale Screening Tool  I have been able to laugh and see the funny side of things. 0  I have looked forward with enjoyment to things. 0  I have blamed myself unnecessarily when things went wrong. 0  I have been anxious or worried for no good reason.  0  I have felt scared or panicky for no good reason. 0  Things have been getting on top of me. 0  I have been so unhappy that I have had difficulty sleeping. 0  I have felt sad or miserable. 0  I have been so unhappy that I have been crying. 0  The thought of harming myself has occurred to me. 0  Edinburgh Postnatal Depression Scale Total 0     After visit meds:  Allergies as of 11/05/2022   No Known Allergies      Medication List     STOP taking these medications    aspirin EC 81 MG tablet   Blood Pressure Monitoring Devi   cyclobenzaprine 10 MG tablet Commonly known as: FLEXERIL   Mag-Oxide 200 MG Tabs Generic drug: Magnesium Oxide -Mg Supplement   metroNIDAZOLE 500 MG tablet Commonly known as: FLAGYL   pantoprazole 20 MG tablet Commonly known as: Protonix   Prenatal Plus Vitamin/Mineral 27-1 MG Tabs       TAKE these  medications    acetaminophen 325 MG tablet Commonly known as: Tylenol Take 2 tablets (650 mg total) by mouth every 4 (four) hours as needed (for pain scale < 4).   etonogestrel 68 MG Impl implant Commonly known as: NEXPLANON 1 each (68 mg total) by Subdermal route.   ibuprofen 600 MG tablet Commonly known as: ADVIL Take 1 tablet (600 mg total) by mouth every 6 (six) hours.         Discharge home in stable condition Infant Feeding: Bottle and Breast Infant Disposition:home with mother Discharge instruction: per After Visit Summary and Postpartum booklet. Activity: Advance as tolerated. Pelvic rest for 6 weeks.  Diet: routine diet Future Appointments:No future appointments. Follow up Visit:  Follow-up Information     [facility] Follow up in 1 week(s).   Specialty: Obstetrics and Gynecology Why: For a blood pressure check. Contact information: [address] [place] [address] [phone]                 Please schedule this patient for a In person postpartum visit in 6 weeks with the following provider: Any provider. Additional Postpartum F/U: 1 week - had PP preeclampsia   Msg sent to the office on 8/9 by Dr. Para March Low risk pregnancy complicated by:  PPROM at 35 weeks Delivery mode:  Vaginal, Spontaneous Anticipated Birth Control:  Nexplanon placed on 8/9 inpatient.    11/05/2022 Milas Hock, MD

## 2022-11-03 NOTE — Progress Notes (Signed)
LABOR NOTE Leonora Westbrooks is a 22 y.o. G2P1001 at [redacted]w[redacted]d admitted for PROM  Subjective: not feeling contractions  Objective: BP 108/65   Pulse 73   Temp 98 F (36.7 C) (Oral)   Resp 17   Ht 4\' 11"  (1.499 m)   Wt 83.9 kg   LMP 03/02/2022 (Exact Date)   SpO2 100%   BMI 37.37 kg/m  No intake/output data recorded.  FHR baseline 145 bpm, Variability: moderate, Accelerations:present, Decelerations:  Absent Toco: irregular   SVE:   Dilation: 2.5 Effacement (%): 60 Station: -2 Exam by:: Wynelle Bourgeois, CNM  Pitocin NA  Labs: Lab Results  Component Value Date   WBC 8.9 11/02/2022   HGB 11.9 (L) 11/02/2022   HCT 37.3 11/02/2022   MCV 85.9 11/02/2022   PLT 257 11/02/2022    Assessment / Plan: Augmentation of labor, latent labor PPROM @1800  clear fluid starting pit 2x2   Labor: latent Fetal Wellbeing:  Category I Pain Control:  n/a Pre-eclampsia: N/A I/D:   PCN for GBS unknown and preterm Anticipated MOD: NSVB  Cleda Mccreedy SNM 11/03/2022, 12:35 AM

## 2022-11-03 NOTE — Anesthesia Preprocedure Evaluation (Signed)
Anesthesia Evaluation  Patient identified by MRN, date of birth, ID band Patient awake    Reviewed: Allergy & Precautions, H&P , NPO status , Patient's Chart, lab work & pertinent test results, reviewed documented beta blocker date and time   Airway Mallampati: I  TM Distance: >3 FB Neck ROM: full    Dental no notable dental hx. (+) Teeth Intact, Dental Advisory Given   Pulmonary neg pulmonary ROS   Pulmonary exam normal breath sounds clear to auscultation       Cardiovascular hypertension, Normal cardiovascular exam Rhythm:regular Rate:Normal     Neuro/Psych negative neurological ROS  negative psych ROS   GI/Hepatic negative GI ROS, Neg liver ROS,,,  Endo/Other  negative endocrine ROS    Renal/GU negative Renal ROS  negative genitourinary   Musculoskeletal   Abdominal   Peds  Hematology negative hematology ROS (+)   Anesthesia Other Findings   Reproductive/Obstetrics (+) Pregnancy                              Anesthesia Physical Anesthesia Plan  ASA: 2  Anesthesia Plan: Epidural   Post-op Pain Management:    Induction: Intravenous  PONV Risk Score and Plan: 2 and Treatment may vary due to age or medical condition  Airway Management Planned: Natural Airway and Simple Face Mask  Additional Equipment: None  Intra-op Plan:   Post-operative Plan:   Informed Consent: I have reviewed the patients History and Physical, chart, labs and discussed the procedure including the risks, benefits and alternatives for the proposed anesthesia with the patient or authorized representative who has indicated his/her understanding and acceptance.     Dental Advisory Given  Plan Discussed with: Anesthesiologist  Anesthesia Plan Comments: (Labs checked- platelets confirmed with RN in room. Fetal heart tracing, per RN, reported to be stable enough for sitting procedure. Discussed epidural, and  patient consents to the procedure:  included risk of possible headache,backache, failed block, allergic reaction, and nerve injury. This patient was asked if she had any questions or concerns before the procedure started.)         Anesthesia Quick Evaluation

## 2022-11-03 NOTE — Progress Notes (Signed)
LABOR NOTE Judy Underwood is a 22 y.o. G2P1001 at [redacted]w[redacted]d admitted for early labor PPROM  Subjective: comfortable with epidural  Objective: BP 124/88   Pulse 85   Temp 97.8 F (36.6 C) (Oral)   Resp 17   Ht 4\' 11"  (1.499 m)   Wt 83.9 kg   LMP 03/02/2022 (Exact Date)   SpO2 100%   BMI 37.37 kg/m  No intake/output data recorded.  FHR baseline 130 bpm, Variability: moderate, Accelerations:present, Decelerations:  Absent Toco: q 2-3 mins   SVE:   Dilation: 3 Effacement (%): 60 Station: -2 Exam by:: AmitaSRN  Pitocin @ 16 mu/min  Labs: Lab Results  Component Value Date   WBC 8.9 11/02/2022   HGB 11.9 (L) 11/02/2022   HCT 37.3 11/02/2022   MCV 85.9 11/02/2022   PLT 257 11/02/2022    Assessment / Plan: Augmentation of labor, early labor PPROM 11/02/22 at 1800 clear fluid, progressing normally on pitocin, currently at 37mu/min epidural in place and patient comfortable.   Labor: early Fetal Wellbeing:  Category I Pain Control:  epidural Pre-eclampsia: N/A I/D:  PCN for GBS+ Anticipated MOD: NSVB  Cleda Mccreedy SNM 11/03/2022, 7:53 AM

## 2022-11-03 NOTE — Progress Notes (Signed)
Labor Progress Note Judy Underwood is a 22 y.o. G2P1001 at [redacted]w[redacted]d presented for early labor and PPROM. S: Called by nursing due to palpating forebag on SVE. Patient comfortable with epidural in place. Patient reports no concerns.  O:  BP 115/77   Pulse 66   Temp 98.8 F (37.1 C) (Oral)   Resp 16   Ht 4\' 11"  (1.499 m)   Wt 83.9 kg   LMP 03/02/2022 (Exact Date)   SpO2 100%   BMI 37.37 kg/m  EFM: 130/minimal variability/Accels present, variable decels present  CVE: Dilation: 5 Effacement (%): 70, 80 Cervical Position: Middle Station: -3 Presentation: Vertex Exam by:: Dr. Ladon Applebaum   A&P: 22 y.o. G2P1001 [redacted]w[redacted]d presented for early labor and PPROM. #Labor: Progressing well. Attempted to AROM remaining forebag, no increase in fluid leakage after attempt. Bloody show noted. Continue pitocin, position changes. Repeat CVE every or 4 hours until in active stages in labor. #Pain: Per patient request #FWB: Cat 2 #GBS  pending  Olga Millers, DO PGY-3 Family Medicine 12:25 PM

## 2022-11-03 NOTE — Lactation Note (Signed)
This note was copied from a baby's chart. Lactation Consultation Note Mom told LC that she is only going to formula feed.  Patient Name: Girl Adlene Wurzel [id number] Date: 11/03/2022 Age:22 hours     Maternal Data    Feeding Mother's Current Feeding Choice: Formula Nipple Type: Extra Slow Flow  LATCH Score                    Lactation Tools Discussed/Used    Interventions    Discharge    Consult Status Consult Status: Complete    Mikayela Deats G 11/03/2022, 10:08 PM

## 2022-11-04 MED ORDER — LIDOCAINE HCL 1 % IJ SOLN
0.0000 mL | Freq: Once | INTRAMUSCULAR | Status: AC | PRN
  Administered 2022-11-05: 20 mL via INTRADERMAL
  Filled 2022-11-04: qty 20

## 2022-11-04 MED ORDER — ETONOGESTREL 68 MG ~~LOC~~ IMPL
68.0000 mg | DRUG_IMPLANT | Freq: Once | SUBCUTANEOUS | Status: AC
  Administered 2022-11-05: 68 mg via SUBCUTANEOUS
  Filled 2022-11-04: qty 1

## 2022-11-04 NOTE — Progress Notes (Signed)
POSTPARTUM PROGRESS NOTE  Post Partum Day 1  Subjective:  Judy Underwood is a 22 y.o. [id number] PPD#1 s/p VD at [redacted]w[redacted]d.  She reports she is doing well. No acute events overnight. She denies any problems with ambulating, voiding or po intake. Denies nausea or vomiting.  Pain is well controlled.  Lochia is stable and decreasing. Denies any headache or change in vision.  Objective: Blood pressure 127/82, pulse 75, temperature (!) 97.4 F (36.3 C), temperature source Oral, resp. rate 18, height 4\' 11"  (1.499 m), weight 83.9 kg, last menstrual period 03/02/2022, SpO2 100%, unknown if currently breastfeeding.  Physical Exam:  General: alert, cooperative and no distress Chest: no respiratory distress Heart:regular rate, distal pulses intact Uterine Fundus: firm, appropriately tender DVT Evaluation: No calf swelling or tenderness Extremities: No edema Skin: warm, dry  Recent Labs    11/02/22 2207  HGB 11.9*  HCT 37.3    Assessment/Plan: Judy Underwood is a 22 y.o. [id number] PPD#1 s/p VD at [redacted]w[redacted]d .  PPD#1 - Doing well  Routine postpartum care  Contraception: Nexplanon Feeding: Bottle Dispo: Plan for discharge 11/05/22.   LOS: 2 days   Olga Millers, DO PGY-3 11/04/2022, 7:42 AM
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
## 2022-11-05 MED ORDER — ACETAMINOPHEN 325 MG PO TABS: 650.0000 mg | ORAL_TABLET | ORAL | 0 refills | Status: AC | PRN

## 2022-11-05 MED ORDER — IBUPROFEN 600 MG PO TABS: 600.0000 mg | ORAL_TABLET | Freq: Four times a day (QID) | ORAL | 0 refills | Status: AC

## 2022-11-08 NOTE — Anesthesia Postprocedure Evaluation (Signed)
Anesthesia Post Note  Patient: Judy Underwood  Procedure(s) Performed: AN AD HOC LABOR EPIDURAL     Patient location during evaluation: Mother Baby Anesthesia Type: Epidural Level of consciousness: awake and alert Pain management: pain level controlled Vital Signs Assessment: post-procedure vital signs reviewed and stable Respiratory status: spontaneous breathing, nonlabored ventilation and respiratory function stable Cardiovascular status: stable Postop Assessment: no headache, no backache and epidural receding Anesthetic complications: no   No notable events documented.  Last Vitals: There were no vitals filed for this visit.  Last Pain: There were no vitals filed for this visit.               Mariann Barter

## 2022-11-12 ENCOUNTER — Ambulatory Visit: Payer: Medicaid Other

## 2022-11-30 ENCOUNTER — Telehealth (HOSPITAL_COMMUNITY): Payer: Self-pay

## 2022-11-30 NOTE — Telephone Encounter (Signed)
11/30/2022 1531  Name: Judy Underwood MRN: [id number] DOB: 2001/03/29  Reason for Call:  Transition of Care Hospital Discharge Call  Contact Status: Patient Contact Status: Complete  Language assistant needed: Interpreter Mode: Interpreter Not Needed        Follow-Up Questions: Do You Have Any Concerns About Your Health As You Heal From Delivery?: Yes What Concerns Do You Have About Your Health?: Patient has questions about duration of bleeding. RN reviewed normal lochia amounts, color, and duration of bleeding. RN also reviewed what to much bleeding looks like and when to call the provider. Patient has no other questions or concerns about her health or healing. Do You Have Any Concerns About Your Infants Health?: Yes What Concerns Do You Have About Your Baby?: Patient states that baby spits up after feedings. No projectile vomiting. RN reviewed burping baby throughout feeding and after feeding. Rn told patient to talk with her pediatrician about her concerns. Patient has no other questions about baby.  Edinburgh Postnatal Depression Scale:  In the Past 7 Days: I have been able to laugh and see the funny side of things.: As much as I always could I have looked forward with enjoyment to things.: As much as I ever did I have blamed myself unnecessarily when things went wrong.: Not very often I have been anxious or worried for no good reason.: No, not at all I have felt scared or panicky for no good reason.: No, not at all Things have been getting on top of me.: No, I have been coping as well as ever I have been so unhappy that I have had difficulty sleeping.: Yes, most of the time I have felt sad or miserable.: No, not at all I have been so unhappy that I have been crying.: Only occasionally The thought of harming myself has occurred to me.: Never Edinburgh Postnatal Depression Scale Total: 5  PHQ2-9 Depression Scale:     Discharge Follow-up: Edinburgh score requires follow  up?: No Patient was advised of the following resources:: Breastfeeding Support Group, Support Group Patient referred to:: Peds  Post-discharge interventions: Reviewed Newborn Safe Sleep Practices  Signature  Signe Colt

## 2022-12-14 ENCOUNTER — Ambulatory Visit: Payer: Medicaid Other | Admitting: Obstetrics and Gynecology

## 2023-01-02 IMAGING — US US OB < 14 WEEKS - US OB TV
1 series · 16 of 28 positions shown · non-contrast
Comparison: None.

CLINICAL DATA: Abdominal pain.

EXAM:
OBSTETRIC <14 WK ULTRASOUND
TECHNIQUE: Transabdominal ultrasound was performed for evaluation of the
gestation as well as the maternal uterus and adnexal regions.

[Series 1: us ob < 14 weeks - us ob tv · 29 acquisitions, 16 frames shown]
[im 1/29]
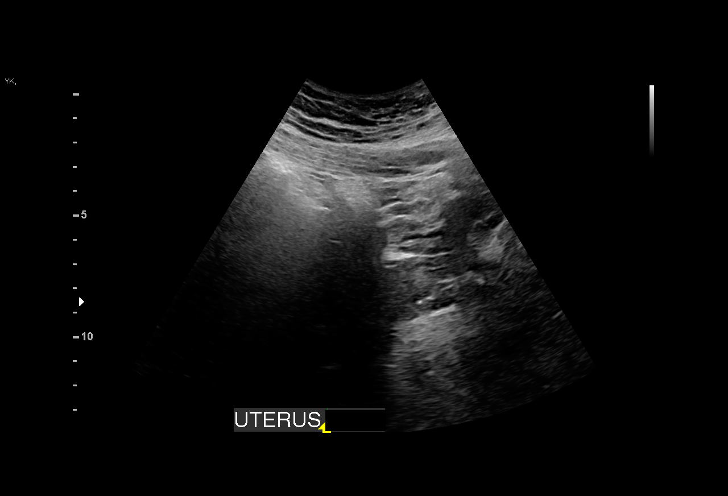
[im 3/29]
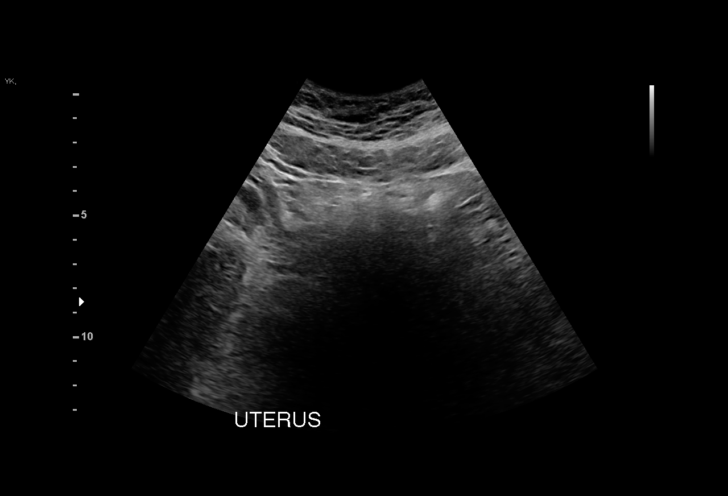
[im 5/29]
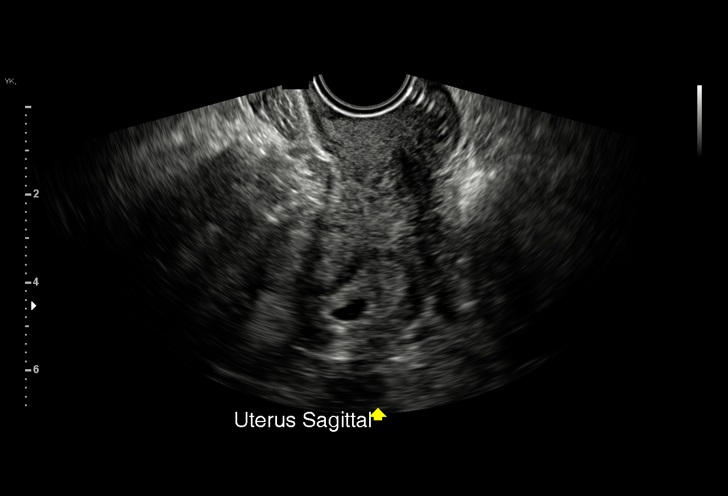
[im 7/29]
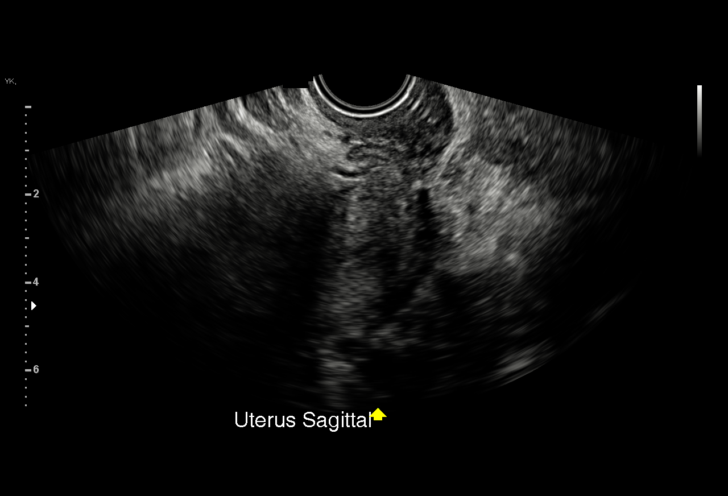
[im 8/29]
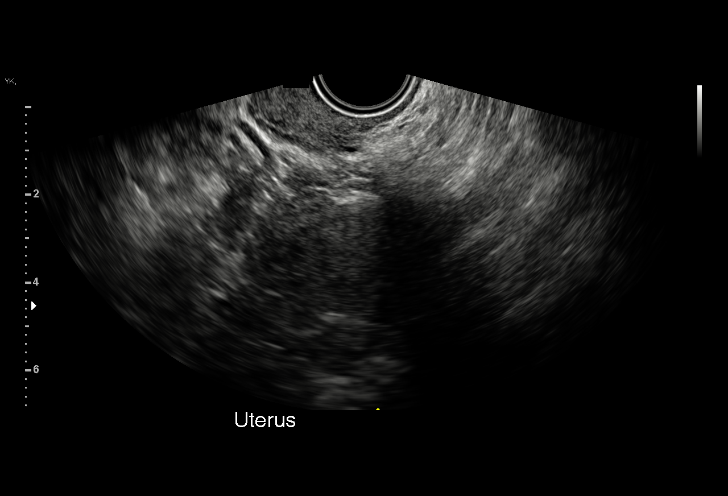
[im 10/29]
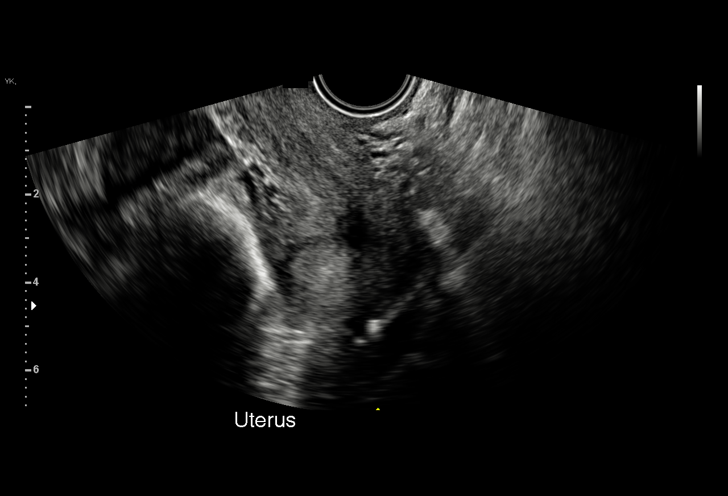
[im 12/29]
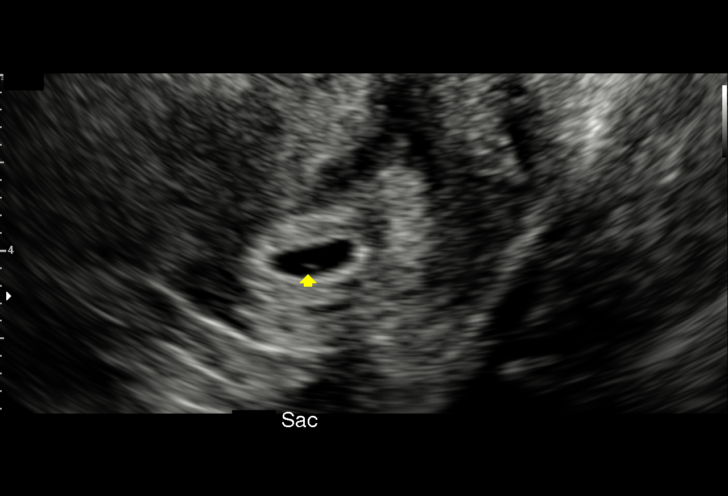
[im 14/29]
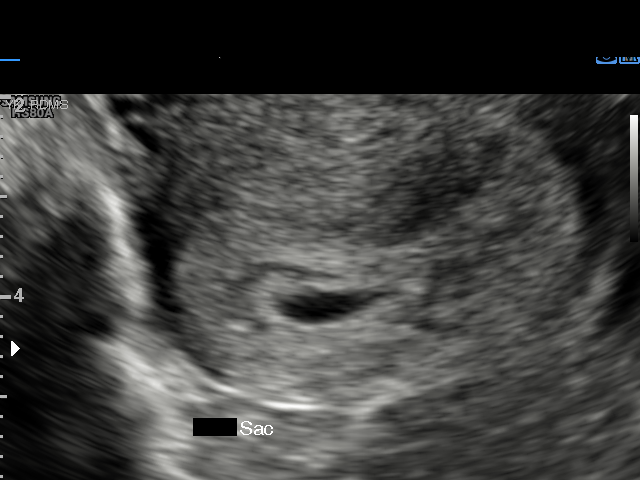
[im 15/29]
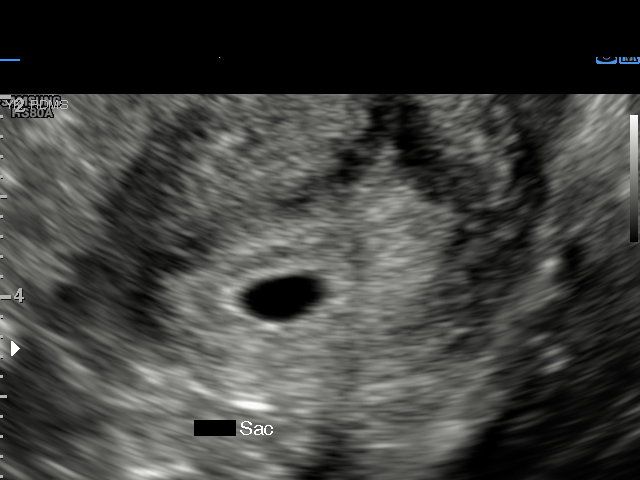
[im 17/29]
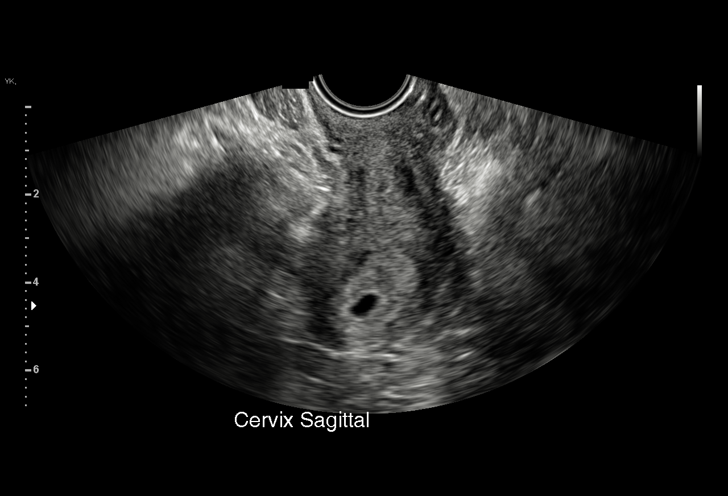
[im 19/29]
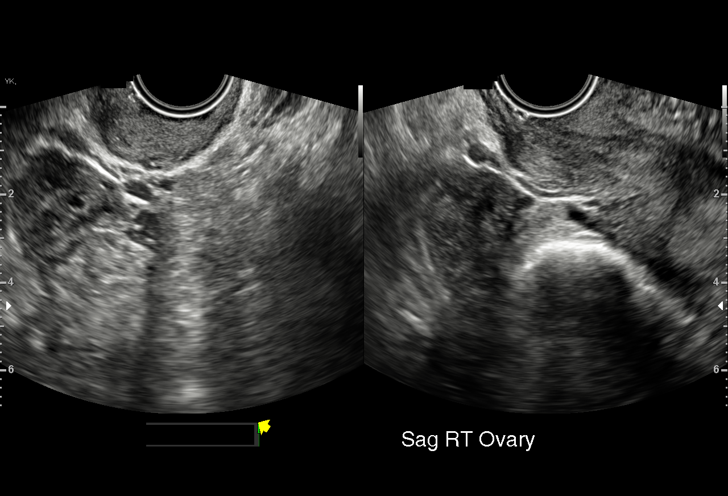
[im 21/29]
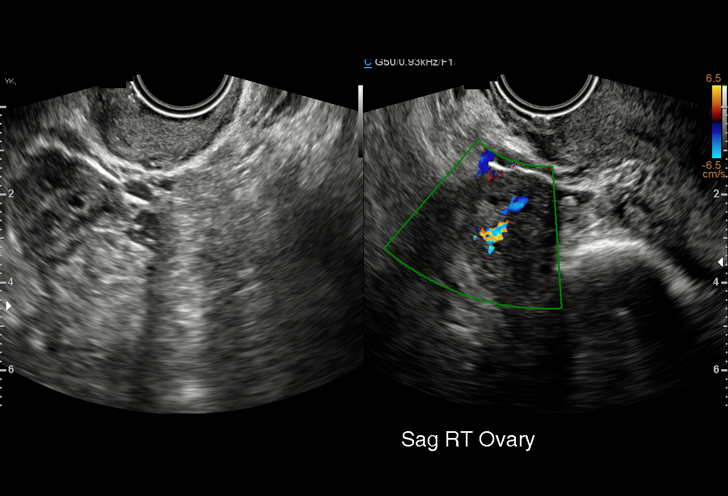
[im 22/29]
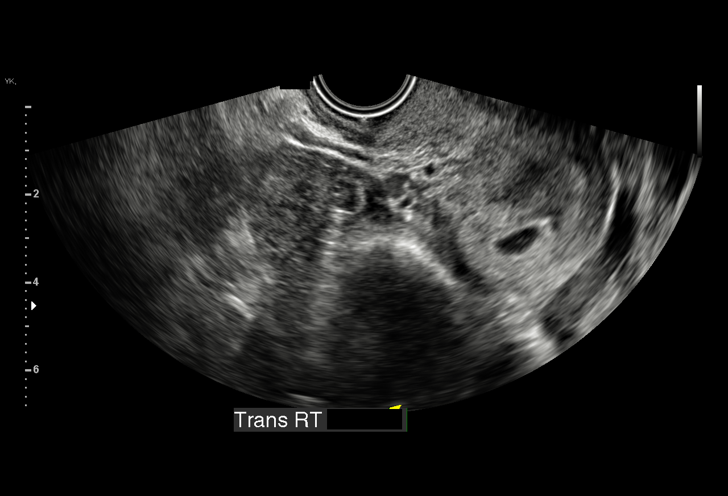
[im 24/29]
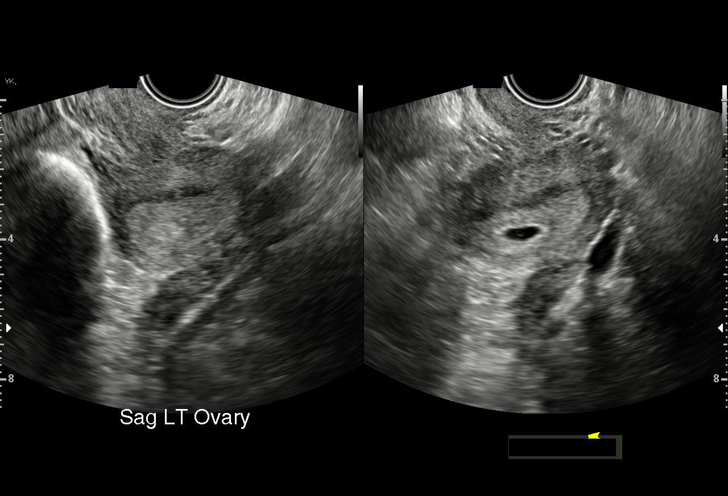
[im 26/29]
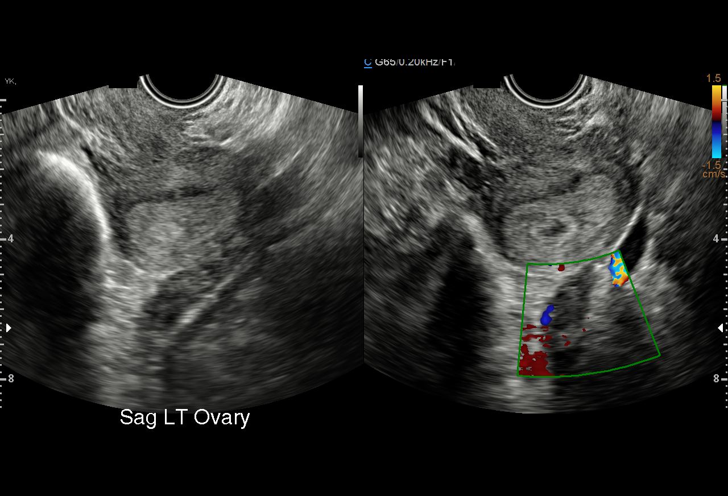
[im 29/29]
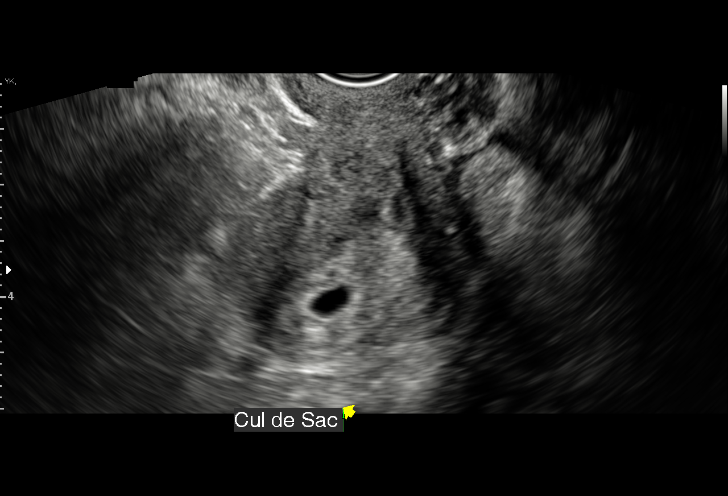

[16 of 28 positions shown; findings below may reference images not displayed]

FINDINGS: Intrauterine gestational sac: Single

Yolk sac:  Visualized

Embryo:  Not Visualized.

MSD:  8.4 mm   5 w   4 d

CRL:     mm    w  d                  US EDC:

Subchorionic hemorrhage:  None visualized.

Maternal uterus/adnexae: Normal.
IMPRESSION: An IUP is identified. There is a gestational sac and a yolk sac. A
fetal pole is not yet seen. Recommend attention on follow-up.
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
## 2023-09-08 ENCOUNTER — Encounter (HOSPITAL_BASED_OUTPATIENT_CLINIC_OR_DEPARTMENT_OTHER): Payer: Self-pay

## 2023-09-08 ENCOUNTER — Ambulatory Visit (HOSPITAL_BASED_OUTPATIENT_CLINIC_OR_DEPARTMENT_OTHER): Admitting: Family Medicine

## 2024-04-28 ENCOUNTER — Emergency Department (HOSPITAL_COMMUNITY)
Admission: EM | Admit: 2024-04-28 | Discharge: 2024-04-28 | Disposition: A | Discharge status: Home / Self Care | Attending: Emergency Medicine | Admitting: Emergency Medicine

## 2024-04-28 ENCOUNTER — Other Ambulatory Visit: Payer: Self-pay

## 2024-04-28 ENCOUNTER — Encounter (HOSPITAL_COMMUNITY): Payer: Self-pay

## 2024-04-28 ENCOUNTER — Emergency Department (HOSPITAL_COMMUNITY)

## 2024-04-28 DIAGNOSIS — R0789 Other chest pain: Secondary | ICD-10-CM | POA: Insufficient documentation

## 2024-04-28 LAB — RESP PANEL BY RT-PCR (RSV, FLU A&B, COVID)  RVPGX2
Influenza A by PCR: NEGATIVE
Influenza B by PCR: NEGATIVE
Resp Syncytial Virus by PCR: NEGATIVE
SARS Coronavirus 2 by RT PCR: NEGATIVE

## 2024-04-28 MED ORDER — KETOROLAC TROMETHAMINE 30 MG/ML IJ SOLN
15.0000 mg | Freq: Once | INTRAMUSCULAR | Status: AC
  Administered 2024-04-28: 15 mg via INTRAVENOUS
  Filled 2024-04-28: qty 1
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
## 2024-04-28 NOTE — Discharge Instructions (Signed)
 Use ibuprofen  and/or Tylenol  as needed

## 2024-04-28 NOTE — ED Provider Notes (Signed)
 " [place] [facility] AT [facility] Provider Note   CSN: [id number] Arrival date & time: 04/28/24  2042     Patient presents with: Chest Pain   Judy Underwood is a 23 y.o. female.   24 year old female presents with constant sharp chest pain is worse with movement x 24 hours.  States that she was carrying heavy object and is when its symptoms started.  Denies any dyspnea or diaphoresis.  No nausea or vomiting.  Better with remaining still in certain positions.  No treatment used prior to arrival.  Denies any anginal type symptoms.       Prior to Admission medications  Medication Sig Start Date End Date Taking? Authorizing Provider  acetaminophen  (TYLENOL ) 325 MG tablet Take 2 tablets (650 mg total) by mouth every 4 (four) hours as needed (for pain scale < 4). 11/05/22   Cleatus Moccasin, MD  etonogestrel  (NEXPLANON ) 68 MG IMPL implant 1 each (68 mg total) by Subdermal route. 11/05/22   Cleatus Moccasin, MD  ibuprofen  (ADVIL ) 600 MG tablet Take 1 tablet (600 mg total) by mouth every 6 (six) hours. 11/05/22   Cleatus Moccasin, MD    Allergies: Patient has no known allergies.    Review of Systems  All other systems reviewed and are negative.   Updated Vital Signs BP 137/87   Pulse 78   Resp 16   LMP  (LMP Unknown)   SpO2 100%   Breastfeeding No   Physical Exam Vitals and nursing note reviewed.  Constitutional:      General: She is not in acute distress.    Appearance: Normal appearance. She is well-developed. She is not toxic-appearing.  HENT:     Head: Normocephalic and atraumatic.  Eyes:     General: Lids are normal.     Conjunctiva/sclera: Conjunctivae normal.     Pupils: Pupils are equal, round, and reactive to light.  Neck:     Thyroid: No thyroid mass.     Trachea: No tracheal deviation.  Cardiovascular:     Rate and Rhythm: Normal rate and regular rhythm.     Heart sounds: Normal heart sounds. No murmur heard.    No gallop.  Pulmonary:      Effort: Pulmonary effort is normal. No respiratory distress.     Breath sounds: Normal breath sounds. No stridor. No decreased breath sounds, wheezing, rhonchi or rales.  Chest:     Chest wall: Tenderness present.    Abdominal:     General: There is no distension.     Palpations: Abdomen is soft.     Tenderness: There is no abdominal tenderness. There is no rebound.  Musculoskeletal:        General: No tenderness. Normal range of motion.     Cervical back: Normal range of motion and neck supple.  Skin:    General: Skin is warm and dry.     Findings: No abrasion or rash.  Neurological:     Mental Status: She is alert and oriented to person, place, and time. Mental status is at baseline.     GCS: GCS eye subscore is 4. GCS verbal subscore is 5. GCS motor subscore is 6.     Cranial Nerves: No cranial nerve deficit.     Sensory: No sensory deficit.     Motor: Motor function is intact.  Psychiatric:        Attention and Perception: Attention normal.        Speech: Speech normal.  Behavior: Behavior normal.     (all labs ordered are listed, but only abnormal results are displayed) Labs Reviewed - No data to display  EKG: EKG Interpretation Date/Time:  Saturday April 28 2024 21:50:58 EST Ventricular Rate:  73 PR Interval:  125 QRS Duration:  84 QT Interval:  389 QTC Calculation: 429 R Axis:   37  Text Interpretation: Sinus arrhythmia Borderline T abnormalities, anterior leads No significant change since last tracing Confirmed by Dasie Faden ([id number]) on 04/28/2024 10:11:04 PM  Radiology: No results found.   Procedures   Medications Ordered in the ED  ketorolac  (TORADOL ) 30 MG/ML injection 15 mg (has no administration in time range)                                    Medical Decision Making Amount and/or Complexity of Data Reviewed Radiology: ordered. ECG/medicine tests: ordered.  Risk Prescription drug management.   Patient's EKG shows sinus rhythm.   Chest x-ray without acute findings.  Patient with reproducible chest wall pain.  Given Toradol  and feels better.  Low suspicion for ACS or PE.  No concern for dissection.  Will discharge home     Final diagnoses:  None    ED Discharge Orders     None          Dasie Faden, MD 04/28/24 2224  "
